# Patient Record
Sex: Female | Born: 1965
Health system: Southern US, Community
[De-identification: ages and names within clinical notes are randomized; demographics above are authoritative.]

## PROBLEM LIST (undated history)

## (undated) DIAGNOSIS — I10 Essential (primary) hypertension: Secondary | ICD-10-CM

## (undated) DIAGNOSIS — Z720 Tobacco use: Secondary | ICD-10-CM

## (undated) DIAGNOSIS — R0600 Dyspnea, unspecified: Secondary | ICD-10-CM

## (undated) DIAGNOSIS — R7989 Other specified abnormal findings of blood chemistry: Secondary | ICD-10-CM

## (undated) DIAGNOSIS — R9431 Abnormal electrocardiogram [ECG] [EKG]: Secondary | ICD-10-CM

## (undated) DIAGNOSIS — R778 Other specified abnormalities of plasma proteins: Secondary | ICD-10-CM

## (undated) DIAGNOSIS — I509 Heart failure, unspecified: Secondary | ICD-10-CM

## (undated) DIAGNOSIS — R600 Localized edema: Secondary | ICD-10-CM

---

## 1999-10-17 ENCOUNTER — Emergency Department (HOSPITAL_COMMUNITY): Admission: EM | Admit: 1999-10-17 | Discharge: 1999-10-17 | Payer: Self-pay | Admitting: Emergency Medicine

## 1999-10-24 ENCOUNTER — Emergency Department (HOSPITAL_COMMUNITY): Admission: EM | Admit: 1999-10-24 | Discharge: 1999-10-24 | Payer: Self-pay | Admitting: *Deleted

## 2000-03-22 ENCOUNTER — Inpatient Hospital Stay (HOSPITAL_COMMUNITY): Admission: AD | Admit: 2000-03-22 | Discharge: 2000-03-22 | Payer: Self-pay | Admitting: Obstetrics

## 2004-03-21 ENCOUNTER — Emergency Department (HOSPITAL_COMMUNITY): Admission: EM | Admit: 2004-03-21 | Discharge: 2004-03-21 | Payer: Self-pay | Admitting: Emergency Medicine

## 2004-05-25 ENCOUNTER — Encounter: Admission: RE | Admit: 2004-05-25 | Discharge: 2004-05-25 | Payer: Self-pay | Admitting: Internal Medicine

## 2006-03-28 ENCOUNTER — Emergency Department (HOSPITAL_COMMUNITY): Admission: EM | Admit: 2006-03-28 | Discharge: 2006-03-28 | Payer: Self-pay | Admitting: Emergency Medicine

## 2006-05-03 ENCOUNTER — Inpatient Hospital Stay (HOSPITAL_COMMUNITY): Admission: AD | Admit: 2006-05-03 | Discharge: 2006-05-03 | Payer: Self-pay | Admitting: Gynecology

## 2006-05-03 ENCOUNTER — Ambulatory Visit: Payer: Self-pay | Admitting: Certified Nurse Midwife

## 2006-05-04 ENCOUNTER — Ambulatory Visit (HOSPITAL_COMMUNITY): Admission: RE | Admit: 2006-05-04 | Discharge: 2006-05-04 | Payer: Self-pay | Admitting: Gynecology

## 2006-07-28 ENCOUNTER — Ambulatory Visit: Payer: Self-pay | Admitting: *Deleted

## 2006-07-28 ENCOUNTER — Inpatient Hospital Stay (HOSPITAL_COMMUNITY): Admission: AD | Admit: 2006-07-28 | Discharge: 2006-07-28 | Payer: Self-pay | Admitting: Obstetrics & Gynecology

## 2006-08-01 ENCOUNTER — Inpatient Hospital Stay (HOSPITAL_COMMUNITY): Admission: AD | Admit: 2006-08-01 | Discharge: 2006-08-01 | Payer: Self-pay | Admitting: Gynecology

## 2006-08-01 ENCOUNTER — Ambulatory Visit: Payer: Self-pay | Admitting: Obstetrics and Gynecology

## 2006-08-11 ENCOUNTER — Ambulatory Visit (HOSPITAL_COMMUNITY): Admission: RE | Admit: 2006-08-11 | Discharge: 2006-08-11 | Payer: Self-pay | Admitting: Gynecology

## 2006-08-11 ENCOUNTER — Ambulatory Visit: Payer: Self-pay | Admitting: Obstetrics & Gynecology

## 2006-08-18 ENCOUNTER — Ambulatory Visit: Payer: Self-pay | Admitting: Gynecology

## 2006-08-19 ENCOUNTER — Inpatient Hospital Stay (HOSPITAL_COMMUNITY): Admission: AD | Admit: 2006-08-19 | Discharge: 2006-08-21 | Payer: Self-pay | Admitting: Gynecology

## 2006-08-19 ENCOUNTER — Ambulatory Visit: Payer: Self-pay | Admitting: Gynecology

## 2010-08-29 ENCOUNTER — Encounter: Payer: Self-pay | Admitting: Internal Medicine

## 2012-09-10 ENCOUNTER — Other Ambulatory Visit: Payer: Self-pay | Admitting: Internal Medicine

## 2012-09-10 DIAGNOSIS — Z1231 Encounter for screening mammogram for malignant neoplasm of breast: Secondary | ICD-10-CM

## 2012-09-28 ENCOUNTER — Ambulatory Visit: Payer: Self-pay

## 2013-07-18 ENCOUNTER — Ambulatory Visit
Admission: RE | Admit: 2013-07-18 | Discharge: 2013-07-18 | Disposition: A | Discharge status: Home / Self Care | Payer: BC Managed Care – PPO | Source: Ambulatory Visit | Attending: Internal Medicine | Admitting: Internal Medicine

## 2013-07-18 DIAGNOSIS — Z1231 Encounter for screening mammogram for malignant neoplasm of breast: Secondary | ICD-10-CM

## 2017-05-11 ENCOUNTER — Inpatient Hospital Stay (HOSPITAL_COMMUNITY)
Admission: EM | Admit: 2017-05-11 | Discharge: 2017-05-14 | DRG: 293 | Disposition: A | Discharge status: Home / Self Care | Payer: 59 | Attending: Internal Medicine | Admitting: Internal Medicine

## 2017-05-11 ENCOUNTER — Ambulatory Visit (INDEPENDENT_AMBULATORY_CARE_PROVIDER_SITE_OTHER): Payer: 59 | Admitting: Physician Assistant

## 2017-05-11 ENCOUNTER — Ambulatory Visit (INDEPENDENT_AMBULATORY_CARE_PROVIDER_SITE_OTHER): Payer: 59

## 2017-05-11 ENCOUNTER — Emergency Department (HOSPITAL_COMMUNITY): Payer: 59

## 2017-05-11 ENCOUNTER — Encounter (HOSPITAL_COMMUNITY): Payer: Self-pay | Admitting: Emergency Medicine

## 2017-05-11 ENCOUNTER — Encounter: Payer: Self-pay | Admitting: Physician Assistant

## 2017-05-11 VITALS — BP 178/98 | HR 97 | Temp 98.5°F | Resp 16 | Ht 66.0 in | Wt 216.0 lb

## 2017-05-11 DIAGNOSIS — R05 Cough: Secondary | ICD-10-CM

## 2017-05-11 DIAGNOSIS — Z6833 Body mass index (BMI) 33.0-33.9, adult: Secondary | ICD-10-CM

## 2017-05-11 DIAGNOSIS — Z8249 Family history of ischemic heart disease and other diseases of the circulatory system: Secondary | ICD-10-CM

## 2017-05-11 DIAGNOSIS — R778 Other specified abnormalities of plasma proteins: Secondary | ICD-10-CM | POA: Diagnosis present

## 2017-05-11 DIAGNOSIS — I43 Cardiomyopathy in diseases classified elsewhere: Secondary | ICD-10-CM | POA: Diagnosis present

## 2017-05-11 DIAGNOSIS — R0600 Dyspnea, unspecified: Secondary | ICD-10-CM

## 2017-05-11 DIAGNOSIS — D649 Anemia, unspecified: Secondary | ICD-10-CM | POA: Diagnosis present

## 2017-05-11 DIAGNOSIS — I509 Heart failure, unspecified: Secondary | ICD-10-CM

## 2017-05-11 DIAGNOSIS — F17210 Nicotine dependence, cigarettes, uncomplicated: Secondary | ICD-10-CM | POA: Diagnosis present

## 2017-05-11 DIAGNOSIS — R9431 Abnormal electrocardiogram [ECG] [EKG]: Secondary | ICD-10-CM | POA: Diagnosis not present

## 2017-05-11 DIAGNOSIS — I10 Essential (primary) hypertension: Secondary | ICD-10-CM | POA: Diagnosis present

## 2017-05-11 DIAGNOSIS — R7989 Other specified abnormal findings of blood chemistry: Secondary | ICD-10-CM | POA: Diagnosis present

## 2017-05-11 DIAGNOSIS — R748 Abnormal levels of other serum enzymes: Secondary | ICD-10-CM | POA: Diagnosis not present

## 2017-05-11 DIAGNOSIS — R0602 Shortness of breath: Secondary | ICD-10-CM

## 2017-05-11 DIAGNOSIS — Z23 Encounter for immunization: Secondary | ICD-10-CM

## 2017-05-11 DIAGNOSIS — Z720 Tobacco use: Secondary | ICD-10-CM | POA: Diagnosis not present

## 2017-05-11 DIAGNOSIS — R600 Localized edema: Secondary | ICD-10-CM | POA: Diagnosis present

## 2017-05-11 DIAGNOSIS — R059 Cough, unspecified: Secondary | ICD-10-CM

## 2017-05-11 DIAGNOSIS — I110 Hypertensive heart disease with heart failure: Principal | ICD-10-CM | POA: Diagnosis present

## 2017-05-11 DIAGNOSIS — Z9114 Patient's other noncompliance with medication regimen: Secondary | ICD-10-CM

## 2017-05-11 DIAGNOSIS — I5043 Acute on chronic combined systolic (congestive) and diastolic (congestive) heart failure: Secondary | ICD-10-CM | POA: Diagnosis not present

## 2017-05-11 DIAGNOSIS — I5041 Acute combined systolic (congestive) and diastolic (congestive) heart failure: Secondary | ICD-10-CM | POA: Diagnosis present

## 2017-05-11 DIAGNOSIS — R0601 Orthopnea: Secondary | ICD-10-CM | POA: Diagnosis present

## 2017-05-11 DIAGNOSIS — E669 Obesity, unspecified: Secondary | ICD-10-CM | POA: Diagnosis present

## 2017-05-11 DIAGNOSIS — Z791 Long term (current) use of non-steroidal anti-inflammatories (NSAID): Secondary | ICD-10-CM

## 2017-05-11 HISTORY — DX: Dyspnea, unspecified: R06.00

## 2017-05-11 HISTORY — DX: Other specified abnormal findings of blood chemistry: R79.89

## 2017-05-11 HISTORY — DX: Other specified abnormalities of plasma proteins: R77.8

## 2017-05-11 HISTORY — DX: Tobacco use: Z72.0

## 2017-05-11 HISTORY — DX: Essential (primary) hypertension: I10

## 2017-05-11 HISTORY — DX: Localized edema: R60.0

## 2017-05-11 HISTORY — DX: Abnormal electrocardiogram (ECG) (EKG): R94.31

## 2017-05-11 LAB — BASIC METABOLIC PANEL
Anion gap: 7 (ref 5–15)
BUN: 7 mg/dL (ref 6–20)
CO2: 24 mmol/L (ref 22–32)
Calcium: 8.9 mg/dL (ref 8.9–10.3)
Chloride: 106 mmol/L (ref 101–111)
Creatinine, Ser: 0.7 mg/dL (ref 0.44–1.00)
GFR calc Af Amer: >60 mL/min (ref >60)
GFR calc non Af Amer: >60 mL/min (ref >60)
Glucose, Bld: 91 mg/dL (ref 65–99)
Potassium: 4.1 mmol/L (ref 3.5–5.1)
Sodium: 137 mmol/L (ref 135–145)

## 2017-05-11 LAB — CBC WITH DIFFERENTIAL/PLATELET
Basophils Absolute: 0 10*3/uL (ref 0.0–0.1)
Basophils Relative: 0 %
Eosinophils Absolute: 0.1 10*3/uL (ref 0.0–0.7)
Eosinophils Relative: 2 %
HCT: 35.6 % — ABNORMAL LOW (ref 36.0–46.0)
Hemoglobin: 11 g/dL — ABNORMAL LOW (ref 12.0–15.0)
Lymphocytes Relative: 40 %
Lymphs Abs: 2 10*3/uL (ref 0.7–4.0)
MCH: 27.5 pg (ref 26.0–34.0)
MCHC: 30.9 g/dL (ref 30.0–36.0)
MCV: 89 fL (ref 78.0–100.0)
Monocytes Absolute: 0.4 10*3/uL (ref 0.1–1.0)
Monocytes Relative: 8 %
Neutro Abs: 2.4 10*3/uL (ref 1.7–7.7)
Neutrophils Relative %: 50 %
Platelets: 196 10*3/uL (ref 150–400)
RBC: 4 MIL/uL (ref 3.87–5.11)
RDW: 14.5 % (ref 11.5–15.5)
WBC: 4.9 10*3/uL (ref 4.0–10.5)

## 2017-05-11 LAB — LIPID PANEL
CHOL/HDL RATIO: 2.5 ratio
CHOLESTEROL: 140 mg/dL (ref 0–200)
HDL: 56 mg/dL (ref >40)
LDL Cholesterol: 60 mg/dL (ref 0–99)
Triglycerides: 120 mg/dL (ref <150)
VLDL: 24 mg/dL (ref 0–40)

## 2017-05-11 LAB — TROPONIN I
Troponin I: 0.03 ng/mL (ref <0.03)
Troponin I: 0.03 ng/mL (ref <0.03)

## 2017-05-11 LAB — BRAIN NATRIURETIC PEPTIDE: B Natriuretic Peptide: 328.7 pg/mL — ABNORMAL HIGH (ref 0.0–100.0)

## 2017-05-11 MED ORDER — ASPIRIN EC 325 MG PO TBEC
325.0000 mg | DELAYED_RELEASE_TABLET | Freq: Once | ORAL | Status: AC
  Administered 2017-05-11: 325 mg via ORAL
  Filled 2017-05-11: qty 1

## 2017-05-11 MED ORDER — ACETAMINOPHEN 325 MG PO TABS: 650.0000 mg | ORAL_TABLET | Freq: Four times a day (QID) | ORAL | Status: DC | PRN

## 2017-05-11 MED ORDER — SENNOSIDES-DOCUSATE SODIUM 8.6-50 MG PO TABS: 1.0000 | ORAL_TABLET | Freq: Every evening | ORAL | Status: DC | PRN

## 2017-05-11 MED ORDER — FUROSEMIDE 10 MG/ML IJ SOLN
60.0000 mg | Freq: Once | INTRAMUSCULAR | Status: AC
  Administered 2017-05-11: 60 mg via INTRAVENOUS
  Filled 2017-05-11: qty 6

## 2017-05-11 MED ORDER — LOSARTAN POTASSIUM 50 MG PO TABS
50.0000 mg | ORAL_TABLET | Freq: Every day | ORAL | Status: DC
  Filled 2017-05-11: qty 1

## 2017-05-11 MED ORDER — PNEUMOCOCCAL VAC POLYVALENT 25 MCG/0.5ML IJ INJ
0.5000 mL | INJECTION | INTRAMUSCULAR | Status: AC
  Administered 2017-05-14: 0.5 mL via INTRAMUSCULAR
  Filled 2017-05-11: qty 0.5

## 2017-05-11 MED ORDER — NICOTINE 21 MG/24HR TD PT24
21.0000 mg | MEDICATED_PATCH | Freq: Every day | TRANSDERMAL | Status: DC
  Administered 2017-05-11 – 2017-05-13 (×3): 21 mg via TRANSDERMAL
  Filled 2017-05-11 (×4): qty 1

## 2017-05-11 MED ORDER — METOPROLOL SUCCINATE ER 25 MG PO TB24
25.0000 mg | ORAL_TABLET | Freq: Every day | ORAL | Status: DC
  Administered 2017-05-12 – 2017-05-14 (×3): 25 mg via ORAL
  Filled 2017-05-11 (×3): qty 1

## 2017-05-11 MED ORDER — HYDRALAZINE HCL 20 MG/ML IJ SOLN
5.0000 mg | INTRAMUSCULAR | Status: DC | PRN
  Administered 2017-05-11: 10 mg via INTRAVENOUS
  Filled 2017-05-11 (×2): qty 1

## 2017-05-11 MED ORDER — METOPROLOL TARTRATE 25 MG PO TABS: 12.5000 mg | ORAL_TABLET | Freq: Two times a day (BID) | ORAL | Status: DC

## 2017-05-11 MED ORDER — INFLUENZA VAC SPLIT QUAD 0.5 ML IM SUSY
0.5000 mL | PREFILLED_SYRINGE | INTRAMUSCULAR | Status: AC
  Administered 2017-05-14: 0.5 mL via INTRAMUSCULAR
  Filled 2017-05-11: qty 0.5

## 2017-05-11 MED ORDER — FUROSEMIDE 10 MG/ML IJ SOLN
40.0000 mg | Freq: Two times a day (BID) | INTRAMUSCULAR | Status: DC
  Administered 2017-05-12 – 2017-05-13 (×4): 40 mg via INTRAVENOUS
  Filled 2017-05-11 (×5): qty 4

## 2017-05-11 MED ORDER — IBUPROFEN 600 MG PO TABS: 600.0000 mg | ORAL_TABLET | Freq: Three times a day (TID) | ORAL | Status: DC | PRN

## 2017-05-11 MED ORDER — ASPIRIN 325 MG PO TABS
325.0000 mg | ORAL_TABLET | Freq: Every day | ORAL | Status: DC
  Administered 2017-05-12 – 2017-05-14 (×3): 325 mg via ORAL
  Filled 2017-05-11 (×3): qty 1

## 2017-05-11 MED ORDER — ONDANSETRON HCL 4 MG PO TABS: 4.0000 mg | ORAL_TABLET | Freq: Four times a day (QID) | ORAL | Status: DC | PRN

## 2017-05-11 MED ORDER — ENOXAPARIN SODIUM 40 MG/0.4ML ~~LOC~~ SOLN
40.0000 mg | SUBCUTANEOUS | Status: DC
  Filled 2017-05-11: qty 0.4

## 2017-05-11 MED ORDER — ACETAMINOPHEN 650 MG RE SUPP: 650.0000 mg | Freq: Four times a day (QID) | RECTAL | Status: DC | PRN

## 2017-05-11 MED ORDER — ONDANSETRON HCL 4 MG/2ML IJ SOLN: 4.0000 mg | Freq: Four times a day (QID) | INTRAMUSCULAR | Status: DC | PRN

## 2017-05-11 NOTE — H&P (Signed)
History and Physical    Teresa [name] OEH:[id number] DOB: [birth year] DOA: 05/11/2017  PCP: Patient, No Pcp Per Patient coming from: home  Chief Complaint: dyspnea/LE edema  HPI: Teresa [name] is a 51 y.o. female with medical history significant hypertension, noncompliance, tobacco use since emergency department from her primary care physician's office chief complaint of shortness of breath, lower extremity edema. PCP concerned about abnormal EKG. Initial evaluation concerning for new onset heart failure. [facility] asked to admit.  Information is obtained from the patient. She states she was diagnosed with hypertension more than 10 years ago. She has not taken any medications for this in almost 2 years as she "stopped going to the doctor". She states over the last month she has developed worsening dyspnea with exertion lower extremity edema and orthopnea. She denies any chest pain palpitations headache dizziness syncope or near-syncope. She denies abdominal pain nausea vomiting dysuria hematuria frequency or urgency. She admits to some continuing to smoke but denies any diagnosis of COPD or asthma.  ED Course: Emergency department she's afebrile blood pressure with poor control. She is provided with 60 mg of Lasix IV and aspirin.  Review of Systems: As per HPI otherwise all other systems reviewed and are negative.   Ambulatory Status: Ambulates independently and is independent with ADLs  Past Medical History:  Diagnosis Date  . Abnormal EKG   . Dyspnea   . Elevated troponin   . HTN (hypertension)   . Leg edema   . Tobacco abuse     History reviewed. No pertinent surgical history.  Social History   Social History  . Marital status: Divorced    Spouse name: N/A  . Number of children: N/A  . Years of education: N/A   Occupational History  . Not on file.   Social History Main Topics  . Smoking status: Current Every Day Smoker  . Smokeless tobacco: Never Used  .  Alcohol use Not on file  . Drug use: Unknown  . Sexual activity: Not on file   Other Topics Concern  . Not on file   Social History Narrative  . No narrative on file    Not on File  Family History  Problem Relation Age of Onset  . CAD Mother   . Diabetes Mother     Prior to Admission medications   Medication Sig Start Date End Date Taking? Authorizing Provider  ibuprofen (ADVIL,MOTRIN) 200 MG tablet Take 600 mg by mouth every 8 (eight) hours as needed for moderate pain.   Yes [provider]  Pseudoephedrine-APAP-DM (DAYQUIL PO) Take 2 capsules by mouth as needed.   Yes [provider]    Physical Exam: Vitals:   05/11/17 1345 05/11/17 1350 05/11/17 1445 05/11/17 1530  BP: (!) 157/90 (!) 163/102 (!) 173/90 (!) 166/85  Pulse: 65 81 71 88  Resp: 13  18 (!) 22  Temp:      TempSrc:      SpO2: 97% 96% 94% 98%  Weight:      Height:         General:  Appears calm and comfortable Sitting straight up in bed in no acute distress Eyes:  PERRL, EOMI, normal lids, iris ENT:  grossly normal hearing, lips & tongue, mucous membranes of her mouth are moist and pink very poor dentition Neck:  no LAD, masses or thyromegaly Cardiovascular:  RRR, no m/r/g. Trace LE edema.  Respiratory:   Normal respiratory effort. breath sounds with fair air movement. No  wheezes no crackles no rails Abdomen:  soft, ntnd, obese positive bowel sounds no guarding or rebounding Skin:  no rash or induration seen on limited exam Musculoskeletal:  grossly normal tone BUE/BLE, good ROM, no bony abnormality Psychiatric:  grossly normal mood and affect, speech fluent and appropriate, AOx3 Neurologic:  CN 2-12 grossly intact, moves all extremities in coordinated fashion, sensation intact  Labs on Admission: I have personally reviewed following labs and imaging studies  CBC:  Recent Labs Lab 05/11/17 1323  WBC 4.9  NEUTROABS 2.4  HGB 11.0*  HCT 35.6*  MCV 89.0  PLT 196   Basic  Metabolic Panel:  Recent Labs Lab 05/11/17 1323  NA 137  K 4.1  CL 106  CO2 24  GLUCOSE 91  BUN 7  CREATININE 0.70  CALCIUM 8.9   GFR: Estimated Creatinine Clearance: 97.1 mL/min (by C-G formula based on SCr of 0.7 mg/dL). Liver Function Tests: No results for input(s): AST, ALT, ALKPHOS, BILITOT, PROT, ALBUMIN in the last 168 hours. No results for input(s): LIPASE, AMYLASE in the last 168 hours. No results for input(s): AMMONIA in the last 168 hours. Coagulation Profile: No results for input(s): INR, PROTIME in the last 168 hours. Cardiac Enzymes:  Recent Labs Lab 05/11/17 1323  TROPONINI 0.03*   BNP (last 3 results) No results for input(s): PROBNP in the last 8760 hours. HbA1C: No results for input(s): HGBA1C in the last 72 hours. CBG: No results for input(s): GLUCAP in the last 168 hours. Lipid Profile: No results for input(s): CHOL, HDL, LDLCALC, TRIG, CHOLHDL, LDLDIRECT in the last 72 hours. Thyroid Function Tests: No results for input(s): TSH, T4TOTAL, FREET4, T3FREE, THYROIDAB in the last 72 hours. Anemia Panel: No results for input(s): VITAMINB12, FOLATE, FERRITIN, TIBC, IRON, RETICCTPCT in the last 72 hours. Urine analysis: No results found for: COLORURINE, APPEARANCEUR, LABSPEC, PHURINE, GLUCOSEU, HGBUR, BILIRUBINUR, KETONESUR, PROTEINUR, UROBILINOGEN, NITRITE, LEUKOCYTESUR  Creatinine Clearance: Estimated Creatinine Clearance: 97.1 mL/min (by C-G formula based on SCr of 0.7 mg/dL).  Sepsis Labs: (procalcitonin:4,lacticidven:4) )No results found for this or any previous visit (from the past 240 hour(s)).   Radiological Exams on Admission: Dg Chest 2 View  Result Date: 05/11/2017 CLINICAL DATA:  Initial evaluation for acute bilateral chest pain. EXAM: CHEST  2 VIEW COMPARISON:  Prior radiograph from 05/11/2017. FINDINGS: Moderate cardiomegaly, stable. Mediastinal silhouette within normal limits. Lungs normally inflated. Mild diffuse vascular and  interstitial prominence, suggesting mild pulmonary interstitial congestion. No frank alveolar edema. Superimposed streaky left basilar atelectasis. No consolidative airspace disease. No definite pleural effusion. No pneumothorax. No acute osseus abnormality. Degenerative changes present about the shoulders. IMPRESSION: 1. Cardiomegaly with mild diffuse pulmonary interstitial congestion without frank pulmonary edema. 2. Mild left basilar atelectasis.  No focal infiltrates identified. Electronically Signed   By: Rise Mu M.D.   On: 05/11/2017 14:01   Dg Chest 2 View  Result Date: 05/11/2017 CLINICAL DATA:  Cough, shortness of breath. EXAM: CHEST  2 VIEW COMPARISON:  None. FINDINGS: Mild cardiomegaly is noted with mild central pulmonary vascular congestion. No pneumothorax or pleural effusion is noted. Bony thorax is unremarkable. No consolidative process is noted. IMPRESSION: Mild cardiomegaly with mild central pulmonary vascular congestion. Electronically Signed   By: Teresa Raider, M.D.   On: 05/11/2017 11:39    EKG: Independently reviewed. Sinus rhythm LVH with IVCD and secondary repol abnrm   Assessment/Plan Principal Problem:   Dyspnea Active Problems:   Hypertension   Orthopnea   Leg edema   Anemia  Elevated troponin   Abnormal EKG   #1. Dyspnea/LE edema/orthopnea. Likely related to early CHF in the setting of uncontrolled blood pressure. Chest x-ray with mild cardiomegaly and mild central pulmonary vascular congestion. BNP 328. -Admit to telemetry -Blood pressure control -Monitor intake and output -Continue Lasix -Obtain an echocardiogram -Await cardiology recommendation  #2. Elevated troponin/abnormal EKG. EKG with sinus rhythm LVH. Initial troponin 0.03. Patient denies any chest pain. Provided with an aspirin in the emergency department -Cycle troponin -Serial EKG -Continue aspirin -Obtain a lipid panel -Await cardiology recommendations  #3. Hypertension.  Patient noncompliant with antihypertensive meds for almost 2 years. She is unsure of what med she's been on previously. She received 60 mg Lasix in the emergency department. -Continue Lasix IV for now secondary to #1 - Start Hydralazine IV for prn control. Would likely benefit longterm from ACEi and Bblocker though not during acute new onset failure and diuresis.  -Monitor closely    DVT prophylaxis: lovenox  Code Status: full  Family Communication: none present  Disposition Plan: home  Consults called: [place] Admission status: obs    Toya Smothers M MD [facility]  If 7PM-7AM, please contact night-coverage [email or web address] Password [facility]  05/11/2017, 5:02 PM      Attending MD note  Patient was seen, examined,treatment plan was discussed with the  Advance Practice Provider.  I have personally reviewed the clinical findings, lab,EKG, imaging studies and management of this patient in detail.I have also reviewed the orders written for this patient which were under my direction. I agree with the documentation, as recorded by the Advance Practice Provider.   Teresa [name] is a 51 y.o. 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 Pt chronically noncompliant w/ antihypertensives. No other medical problems. Family history of cardiovascular disease and early death from heart attack. Elevated troponin negligable and likely from strain. PCP reported EKG changes though none to compare in our system. Current EKG w/ possible LVH but w/o signs of ACS.  ASA given in ED and Lasix for initial diuresis. Echo and cardiology evaluation pending.   Tobacco cessation counseling provided and placed on nicotine patch.   Appreciate the assistance of cardiology in the management of this pt.     Ozella Rocks, MD Family Medicine See Amion for pager # [facility]

## 2017-05-11 NOTE — ED Notes (Signed)
Bedside commode place in room, pt ambulatory to bedside commode independently

## 2017-05-11 NOTE — Consult Note (Signed)
Cardiology Consultation:   Patient ID: Teresa [name]; [id number]; Jul 14, 1966   Admit date: 05/11/2017 Date of Consult: 05/11/2017  Primary Care Provider: Patient, No Pcp Per Primary Cardiologist: None Primary Electrophysiologist:  None    History of Present Illness:   Teresa [name] is a 51 y.o. female with a hx of HTN who is being seen today for the evaluation of possible congestive heart failure  at the request of Dr Teresa [name] Patient has not Sought medical attention for a while. She has HTN and has not taken any meds for it. .She also smokes. Sent to ER by PCP for dyspnea , edema and abnormal ECG. SOB for  A month no chest pain. CXR showed cardiomegaly with mild interstitial edema Labs with normal Cr .70 troponin .03 and BNP 328  ECG shows SR with LVH and strain. She received Iv lasix and is still HTN in ER. She has no complaints currently. Denies excess ETOH or other drugs.    Past Medical History:  Diagnosis Date  . Abnormal EKG   . Dyspnea   . Elevated troponin   . HTN (hypertension)   . Leg edema   . Tobacco abuse     History reviewed. No pertinent surgical history.   Home Medications:  Prior to Admission medications   Medication Sig Start Date End Date Taking? Authorizing Provider  ibuprofen (ADVIL,MOTRIN) 200 MG tablet Take 600 mg by mouth every 8 (eight) hours as needed for moderate pain.   Yes [provider]  Pseudoephedrine-APAP-DM (DAYQUIL PO) Take 2 capsules by mouth as needed.   Yes [provider]    Inpatient Medications: Scheduled Meds: . [START ON 05/12/2017] aspirin  325 mg Oral Daily  . enoxaparin (LOVENOX) injection  40 mg Subcutaneous Q24H  . [START ON 05/12/2017] furosemide  40 mg Intravenous BID  . nicotine  21 mg Transdermal Daily   Continuous Infusions:  PRN Meds: acetaminophen **OR** acetaminophen, hydrALAZINE, ibuprofen, ondansetron **OR** ondansetron (ZOFRAN) IV, senna-docusate  Allergies:   Not on File  Social  History:   Social History   Social History  . Marital status: Divorced    Spouse name: N/A  . Number of children: N/A  . Years of education: N/A   Occupational History  . Not on file.   Social History Main Topics  . Smoking status: Current Every Day Smoker  . Smokeless tobacco: Never Used  . Alcohol use Not on file  . Drug use: Unknown  . Sexual activity: Not on file   Other Topics Concern  . Not on file   Social History Narrative  . No narrative on file    Family History:    Family History  Problem Relation Age of Onset  . CAD Mother   . Diabetes Mother      ROS:  Please see the history of present illness.  ROS  All other ROS reviewed and negative.     Physical Exam/Data:   Vitals:   05/11/17 1530 05/11/17 1600 05/11/17 1730 05/11/17 1752  BP: (!) 166/85 (!) 158/90 (!) 179/99 (!) 180/108  Pulse: 88 76 86 87  Resp: (!) 22  (!) 22 18  Temp:    98.5 F (36.9 C)  TempSrc:    Oral  SpO2: 98% 91% 96% 100%  Weight:    208 lb 14.4 oz (94.8 kg)  Height:    5\' 6"  (1.676 m)    Intake/Output Summary (Last 24 hours) at 05/11/17 1812 Last data filed at 05/11/17  1810  Gross per 24 hour  Intake                0 ml  Output             2100 ml  Net            -2100 ml   Filed Weights   05/11/17 1305 05/11/17 1752  Weight: 216 lb (98 kg) 208 lb 14.4 oz (94.8 kg)   Body mass index is 33.72 kg/m.  General:  Overweight black female in no distress  HEENT: normal Lymph: no adenopathy Neck: no JVD Endocrine:  No thryomegaly Vascular: No carotid bruits; FA pulses 2+ bilaterally without bruits  Cardiac:  normal S1, S2; RRR; no murmur S4 gallop  Lungs:  clear to auscultation bilaterally, no wheezing, rhonchi or rales  Abd: soft, nontender, no hepatomegaly  Ext: Trace LE edema Musculoskeletal:  No deformities, BUE and BLE strength normal and equal Skin: warm and dry  Neuro:  CNs 2-12 intact, no focal abnormalities noted Psych:  Normal affect   EKG:  The EKG was  personally reviewed and demonstrates:  SR with LVH and strain  Telemetry:  Telemetry was personally reviewed and demonstrates:  NSR   Relevant CV Studies: None   Laboratory Data:  Chemistry Recent Labs Lab 05/11/17 1323  NA 137  K 4.1  CL 106  CO2 24  GLUCOSE 91  BUN 7  CREATININE 0.70  CALCIUM 8.9  GFRNONAA >60  GFRAA >60  ANIONGAP 7    No results for input(s): PROT, ALBUMIN, AST, ALT, ALKPHOS, BILITOT in the last 168 hours. Hematology Recent Labs Lab 05/11/17 1323  WBC 4.9  RBC 4.00  HGB 11.0*  HCT 35.6*  MCV 89.0  MCH 27.5  MCHC 30.9  RDW 14.5  PLT 196   Cardiac Enzymes Recent Labs Lab 05/11/17 1323  TROPONINI 0.03*   No results for input(s): TROPIPOC in the last 168 hours.  BNP Recent Labs Lab 05/11/17 1323  BNP 328.7*    DDimer No results for input(s): DDIMER in the last 168 hours.  Radiology/Studies:  Dg Chest 2 View  Result Date: 05/11/2017 CLINICAL DATA:  Initial evaluation for acute bilateral chest pain. EXAM: CHEST  2 VIEW COMPARISON:  Prior radiograph from 05/11/2017. FINDINGS: Moderate cardiomegaly, stable. Mediastinal silhouette within normal limits. Lungs normally inflated. Mild diffuse vascular and interstitial prominence, suggesting mild pulmonary interstitial congestion. No frank alveolar edema. Superimposed streaky left basilar atelectasis. No consolidative airspace disease. No definite pleural effusion. No pneumothorax. No acute osseus abnormality. Degenerative changes present about the shoulders. IMPRESSION: 1. Cardiomegaly with mild diffuse pulmonary interstitial congestion without frank pulmonary edema. 2. Mild left basilar atelectasis.  No focal infiltrates identified. Electronically Signed   By: Rise Mu M.D.   On: 05/11/2017 14:01   Dg Chest 2 View  Result Date: 05/11/2017 CLINICAL DATA:  Cough, shortness of breath. EXAM: CHEST  2 VIEW COMPARISON:  None. FINDINGS: Mild cardiomegaly is noted with mild central pulmonary  vascular congestion. No pneumothorax or pleural effusion is noted. Bony thorax is unremarkable. No consolidative process is noted. IMPRESSION: Mild cardiomegaly with mild central pulmonary vascular congestion. Electronically Signed   By: Lupita Raider, M.D.   On: 05/11/2017 11:39    Assessment and Plan:   1. HTN:  UnRx. Add ACE and beta blocker can change to oral diuretic in am 2. Dyspnea:  Signs of mild volume overload Echo to see if she has diastolic or systolic dysfunction 3.  Smoking primary service has written for nicotine patch 4. Abnormal ECG: LVH with strain troponin negative no chest pain no need for stress testing Echo ordered   I suspect this is all dietary indiscretion, non compliance with meds and HTN heart disease    For questions or updates, please contact [facility] Please consult [email or web address] for contact info under Cardiology/STEMI.   Signed, Charlton Haws, MD  05/11/2017 6:12 PM

## 2017-05-11 NOTE — Patient Instructions (Signed)
     IF you received an x-ray today, you will receive an invoice from [facility]. Please contact [facility] at [phone] with questions or concerns regarding your invoice.   IF you received labwork today, you will receive an invoice from [facility]. Please contact [facility] at [phone] with questions or concerns regarding your invoice.   Our billing staff will not be able to assist you with questions regarding bills from these companies.  You will be contacted with the lab results as soon as they are available. The fastest way to get your results is to activate your My Chart account. Instructions are located on the last page of this paperwork. If you have not heard from us regarding the results in 2 weeks, please contact this office.     

## 2017-05-11 NOTE — ED Triage Notes (Signed)
Pt arrives via [facility] from PCP with concerns of ECG changes. Complaints of SOB x1 month, fatigue, and back pain that radiates to lower abdomen. Pt states hx of hypertension but has not taken HTN meds in over a year. EMS vitals 182/102, HR 98, RR 16, 97% RA, CBG 104.

## 2017-05-11 NOTE — ED Notes (Signed)
CRITICAL VALUE ALERT  Critical Value:  Troponin 0.03  Date & Time Notied:  05/11/2017 @ 1430  Provider Notified: Dr. Freida Busman  Orders Received/Actions taken: n/a

## 2017-05-11 NOTE — Progress Notes (Signed)
PRIMARY CARE AT [place], [place] [address] [phone]  Date:  05/11/2017   Name:  Teresa [name]   DOB:  26-Apr-1966   MRN:  [id number]  PCP:  Patient, No Pcp Per    History of Present Illness:  Teresa [name] is a 51 y.o. female patient who presents to PCP with  Chief Complaint  Patient presents with  . Nausea    in the day at work/ x 1 month  . Shortness of Breath    at night, no so much in daytime, hurts under ribs/ x 41month    1 month of symptoms with progressive worsening.  She has sob when she lays down at night.  She has then noticed some regurgitation.  This rarely will occur during the day, but can.    Then breathing issues began.  At night, she feels like she can not breathe, and her heart is racing.  She will drink tea to help her symptoms.  She has a dry cough.  Worsening to every night.  During the day, her symptoms are not apparent.  Yesterday, she experienced nausea between 2 and 4 pm.  She notes that she is having rattling of her chest and wheezing.  Denies sob.  States she does get hot and cold, but not diaphoretic.  No lower extremity edema. She generally eats banquet dinners, bacon and eggs, fast food.  She does not eat spicy foods.  She eats tomato based foods.  Hamburgers will give her loose stool.  She has upper abdominal pain.   She has not had any sour taste in mouth.   etOH use beer 2 16 oz per day.  She has not had in 2 days.  She has not had any history of pancreas. CAD of mother, passed away due to this in late 598's   She was on blood pressure medication [facility] (diovan).  She stopped for over 1 year.    There are no active problems to display for this patient.   No past medical history on file.  No past surgical history on file.  Social History  Substance Use Topics  . Smoking status: Current Every Day Smoker  . Smokeless tobacco: Never Used  . Alcohol use Not on file    No family history on file.  Not on  File  Medication list has been reviewed and updated.  No current outpatient prescriptions on file prior to visit.   No current facility-administered medications on file prior to visit.     ROS ROS otherwise unremarkable unless listed above.  Physical Examination: BP (!) 178/98   Pulse 97   Temp 98.5 F (36.9 C) (Oral)   Resp 16   Ht '5\' 6"'$  (1.676 m)   Wt 216 lb (98 kg)   SpO2 97%   BMI 34.86 kg/m  Ideal Body Weight: Weight in (lb) to have BMI = 25: 154.6  Physical Exam  Constitutional: She is oriented to person, place, and time. She appears well-developed and well-nourished. No distress.  HENT:  Head: Normocephalic and atraumatic.  Right Ear: External ear normal.  Left Ear: External ear normal.  Eyes: Pupils are equal, round, and reactive to light. Conjunctivae and EOM are normal.  Cardiovascular: Normal rate, regular rhythm, normal heart sounds and normal pulses.  Exam reveals no distant heart sounds and no decreased pulses.   No murmur heard. Pulses:      Radial pulses are 2+ on the right  side, and 2+ on the left side.       Dorsalis pedis pulses are 2+ on the right side, and 2+ on the left side.  Minimal lower extremity edema  Pulmonary/Chest: Effort normal. No respiratory distress.  Neurological: She is alert and oriented to person, place, and time.  Skin: She is not diaphoretic.  Psychiatric: She has a normal mood and affect. Her behavior is normal.   Dg Chest 2 View  Result Date: 05/11/2017 CLINICAL DATA:  Cough, shortness of breath. EXAM: CHEST  2 VIEW COMPARISON:  None. FINDINGS: Mild cardiomegaly is noted with mild central pulmonary vascular congestion. No pneumothorax or pleural effusion is noted. Bony thorax is unremarkable. No consolidative process is noted. IMPRESSION: Mild cardiomegaly with mild central pulmonary vascular congestion. Electronically Signed   By: Marijo Conception, M.D.   On: 05/11/2017 11:39     Assessment and Plan: Teresa [name] is a 51  y.o. female who is here today  May need to insure possible NSTEMI.  Troponin monitoring with stress and echo to possibly follow.  Possible atypical cardiac symptoms, and would best be served immediately.   Ems contacted Possible gerd vs cardiac changes Given oxygen.    Cough - Plan: CBC, CMP14+EGFR, DG Chest 2 View  Shortness of breath - Plan: CBC, CMP14+EGFR, DG Chest 2 View, EKG 12-Lead  Nonspecific abnormal electrocardiogram (ECG) (EKG)  Ivar Drape, PA-C [facility] 10/4/20181:16 PM

## 2017-05-11 NOTE — ED Provider Notes (Signed)
[facility]-EMERGENCY DEPT Provider Note   CSN: [id number] Arrival date & time: 05/11/17  1303     History   Chief Complaint Chief Complaint  Patient presents with  . Shortness of Breath    HPI Teresa [name] is a 51 y.o. female.  51 year old female presents with one-month history of shortness of breath Denies any associated cough or congestion. No fever or chills. 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 Denies any pedal edema.no pleuritic component to this. Does have agreater than 40-pack-year history of tobacco use. Denies any prior diagnosis of COPD or asthma. Denies any anginal type chest pain.was seen in her doctor's office today and had EKG and there was concern for changes and she was sent here for further management. She denies taking any medications at this time and does have a remote history of hypertension but she has been noncompliant with meds for that      History reviewed. No pertinent past medical history.  There are no active problems to display for this patient.   History reviewed. No pertinent surgical history.  OB History    No data available       Home Medications    Prior to Admission medications   Not on File    Family History History reviewed. No pertinent family history.  Social History Social History  Substance Use Topics  . Smoking status: Current Every Day Smoker  . Smokeless tobacco: Never Used  . Alcohol use Not on file     Allergies   Patient has no allergy information on record.   Review of Systems Review of Systems  All other systems reviewed and are negative.    Physical Exam Updated Vital Signs BP (!) 170/92 (BP Location: Left Arm)   Pulse 81   Temp 98.7 F (37.1 C) (Oral)   Ht 1.676 m (5\' 6" )   Wt 98 kg (216 lb)   SpO2 94%   BMI 34.86 kg/m   Physical  Exam  Constitutional: She is oriented to person, place, and time. She appears well-developed and well-nourished.  Non-toxic appearance. No distress.  HENT:  Head: Normocephalic and atraumatic.  Eyes: Pupils are equal, round, and reactive to light. Conjunctivae, EOM and lids are normal.  Neck: Normal range of motion. Neck supple. No tracheal deviation present. No thyroid mass present.  Cardiovascular: Normal rate, regular rhythm and normal heart sounds.  Exam reveals no gallop.   No murmur heard. Pulmonary/Chest: Effort normal and breath sounds normal. No stridor. No respiratory distress. She has no decreased breath sounds. She has no wheezes. She has no rhonchi. She has no rales.  Abdominal: Soft. Normal appearance and bowel sounds are normal. She exhibits no distension. There is no tenderness. There is no rebound and no CVA tenderness.  Musculoskeletal: Normal range of motion. She exhibits no edema or tenderness.  Neurological: She is alert and oriented to person, place, and time. She has normal strength. No cranial nerve deficit or sensory deficit. GCS eye subscore is 4. GCS verbal subscore is 5. GCS motor subscore is 6.  Skin: Skin is warm and dry. No abrasion and no rash noted.  Psychiatric: She has a normal mood and affect. Her speech is normal and behavior is normal.  Nursing note and vitals reviewed.    ED Treatments / Results  Labs (all  labs ordered are listed, but only abnormal results are displayed) Labs Reviewed  CBC WITH DIFFERENTIAL/PLATELET  BRAIN NATRIURETIC PEPTIDE  TROPONIN I  BASIC METABOLIC PANEL    EKG  EKG Interpretation None       Radiology Dg Chest 2 View  Result Date: 05/11/2017 CLINICAL DATA:  Cough, shortness of breath. EXAM: CHEST  2 VIEW COMPARISON:  None. FINDINGS: Mild cardiomegaly is noted with mild central pulmonary vascular congestion. No pneumothorax or pleural effusion is noted. Bony thorax is unremarkable. No consolidative process is noted.  IMPRESSION: Mild cardiomegaly with mild central pulmonary vascular congestion. Electronically Signed   By: Lupita Raider, M.D.   On: 05/11/2017 11:39    Procedures Procedures (including critical care time)  Medications Ordered in ED Medications - No data to display   Initial Impression / Assessment and Plan / ED Course  I have reviewed the triage vital signs and the nursing notes.  Pertinent labs & imaging results that were available during my care of the patient were reviewed by me and considered in my medical decision making (see chart for details).     Patient given Lasix for increased lung edema. Also given aspirin. Discussed with cardiology who will see the patient consultation and request hospitalist admission. Discussed with hospitalist will come and see the patient  Final Clinical Impressions(s) / ED Diagnoses   Final diagnoses:  SOB (shortness of breath)    New Prescriptions New Prescriptions   No medications on file     Lorre Nick, MD 05/11/17 1551

## 2017-05-12 ENCOUNTER — Observation Stay (HOSPITAL_BASED_OUTPATIENT_CLINIC_OR_DEPARTMENT_OTHER): Payer: 59

## 2017-05-12 DIAGNOSIS — F17210 Nicotine dependence, cigarettes, uncomplicated: Secondary | ICD-10-CM | POA: Diagnosis present

## 2017-05-12 DIAGNOSIS — Z8249 Family history of ischemic heart disease and other diseases of the circulatory system: Secondary | ICD-10-CM | POA: Diagnosis not present

## 2017-05-12 DIAGNOSIS — Z6833 Body mass index (BMI) 33.0-33.9, adult: Secondary | ICD-10-CM | POA: Diagnosis not present

## 2017-05-12 DIAGNOSIS — I340 Nonrheumatic mitral (valve) insufficiency: Secondary | ICD-10-CM

## 2017-05-12 DIAGNOSIS — I110 Hypertensive heart disease with heart failure: Secondary | ICD-10-CM | POA: Diagnosis not present

## 2017-05-12 DIAGNOSIS — I5041 Acute combined systolic (congestive) and diastolic (congestive) heart failure: Secondary | ICD-10-CM | POA: Diagnosis present

## 2017-05-12 DIAGNOSIS — R0601 Orthopnea: Secondary | ICD-10-CM | POA: Diagnosis not present

## 2017-05-12 DIAGNOSIS — Z791 Long term (current) use of non-steroidal anti-inflammatories (NSAID): Secondary | ICD-10-CM | POA: Diagnosis not present

## 2017-05-12 DIAGNOSIS — E669 Obesity, unspecified: Secondary | ICD-10-CM | POA: Diagnosis present

## 2017-05-12 DIAGNOSIS — R0600 Dyspnea, unspecified: Secondary | ICD-10-CM | POA: Diagnosis not present

## 2017-05-12 DIAGNOSIS — I43 Cardiomyopathy in diseases classified elsewhere: Secondary | ICD-10-CM | POA: Diagnosis not present

## 2017-05-12 DIAGNOSIS — R600 Localized edema: Secondary | ICD-10-CM | POA: Diagnosis not present

## 2017-05-12 DIAGNOSIS — R9431 Abnormal electrocardiogram [ECG] [EKG]: Secondary | ICD-10-CM | POA: Diagnosis not present

## 2017-05-12 DIAGNOSIS — Z9114 Patient's other noncompliance with medication regimen: Secondary | ICD-10-CM | POA: Diagnosis not present

## 2017-05-12 DIAGNOSIS — R0602 Shortness of breath: Secondary | ICD-10-CM | POA: Diagnosis not present

## 2017-05-12 DIAGNOSIS — Z23 Encounter for immunization: Secondary | ICD-10-CM | POA: Diagnosis not present

## 2017-05-12 DIAGNOSIS — Z720 Tobacco use: Secondary | ICD-10-CM | POA: Diagnosis not present

## 2017-05-12 DIAGNOSIS — I509 Heart failure, unspecified: Secondary | ICD-10-CM | POA: Diagnosis not present

## 2017-05-12 DIAGNOSIS — I10 Essential (primary) hypertension: Secondary | ICD-10-CM | POA: Diagnosis not present

## 2017-05-12 DIAGNOSIS — I5043 Acute on chronic combined systolic (congestive) and diastolic (congestive) heart failure: Secondary | ICD-10-CM | POA: Diagnosis not present

## 2017-05-12 LAB — URINALYSIS, ROUTINE W REFLEX MICROSCOPIC
Bilirubin Urine: NEGATIVE
GLUCOSE, UA: NEGATIVE mg/dL
Hgb urine dipstick: NEGATIVE
Ketones, ur: NEGATIVE mg/dL
LEUKOCYTES UA: NEGATIVE
Nitrite: NEGATIVE
PH: 6 (ref 5.0–8.0)
Protein, ur: NEGATIVE mg/dL
Specific Gravity, Urine: 1.006 (ref 1.005–1.030)

## 2017-05-12 LAB — CMP14+EGFR
ALT: 17 IU/L (ref 0–32)
AST: 16 IU/L (ref 0–40)
Albumin/Globulin Ratio: 1.7 (ref 1.2–2.2)
Albumin: 4.5 g/dL (ref 3.5–5.5)
Alkaline Phosphatase: 116 IU/L (ref 39–117)
BUN/Creatinine Ratio: 11 (ref 9–23)
BUN: 8 mg/dL (ref 6–24)
Bilirubin Total: 0.4 mg/dL (ref 0.0–1.2)
CO2: 23 mmol/L (ref 20–29)
Calcium: 9.3 mg/dL (ref 8.7–10.2)
Chloride: 101 mmol/L (ref 96–106)
Creatinine, Ser: 0.74 mg/dL (ref 0.57–1.00)
GFR calc Af Amer: 108 mL/min/{1.73_m2} (ref >59)
GFR calc non Af Amer: 93 mL/min/{1.73_m2} (ref >59)
Globulin, Total: 2.6 g/dL (ref 1.5–4.5)
Glucose: 94 mg/dL (ref 65–99)
Potassium: 4.5 mmol/L (ref 3.5–5.2)
Sodium: 141 mmol/L (ref 134–144)
Total Protein: 7.1 g/dL (ref 6.0–8.5)

## 2017-05-12 LAB — BASIC METABOLIC PANEL
ANION GAP: 10 (ref 5–15)
BUN: 10 mg/dL (ref 6–20)
CALCIUM: 8.9 mg/dL (ref 8.9–10.3)
CO2: 28 mmol/L (ref 22–32)
Chloride: 101 mmol/L (ref 101–111)
Creatinine, Ser: 0.65 mg/dL (ref 0.44–1.00)
GFR calc non Af Amer: >60 mL/min (ref >60)
Glucose, Bld: 104 mg/dL — ABNORMAL HIGH (ref 65–99)
Potassium: 3.7 mmol/L (ref 3.5–5.1)
Sodium: 139 mmol/L (ref 135–145)

## 2017-05-12 LAB — CBC
HCT: 38.5 % (ref 36.0–46.0)
HEMOGLOBIN: 12.4 g/dL (ref 12.0–15.0)
Hematocrit: 38.5 % (ref 34.0–46.6)
Hemoglobin: 12 g/dL (ref 11.1–15.9)
MCH: 27.8 pg (ref 26.6–33.0)
MCH: 28.9 pg (ref 26.0–34.0)
MCHC: 31.2 g/dL — ABNORMAL LOW (ref 31.5–35.7)
MCHC: 32.2 g/dL (ref 30.0–36.0)
MCV: 89 fL (ref 79–97)
MCV: 89.7 fL (ref 78.0–100.0)
Platelets: 243 10*3/uL (ref 150–379)
Platelets: 228 10*3/uL (ref 150–400)
RBC: 4.32 x10E6/uL (ref 3.77–5.28)
RBC: 4.29 MIL/uL (ref 3.87–5.11)
RDW: 14.5 % (ref 12.3–15.4)
RDW: 14.5 % (ref 11.5–15.5)
WBC: 6.2 10*3/uL (ref 3.4–10.8)
WBC: 4.8 10*3/uL (ref 4.0–10.5)

## 2017-05-12 LAB — ECHOCARDIOGRAM COMPLETE
Height: 66 in
Weight: 3328 oz

## 2017-05-12 LAB — TROPONIN I
TROPONIN I: 0.03 ng/mL — AB (ref <0.03)
Troponin I: 0.03 ng/mL (ref <0.03)

## 2017-05-12 LAB — HIV ANTIBODY (ROUTINE TESTING W REFLEX): HIV SCREEN 4TH GENERATION: NONREACTIVE

## 2017-05-12 MED ORDER — LOSARTAN POTASSIUM 50 MG PO TABS
100.0000 mg | ORAL_TABLET | Freq: Every day | ORAL | Status: DC
  Administered 2017-05-12 – 2017-05-14 (×3): 100 mg via ORAL
  Filled 2017-05-12 (×3): qty 2

## 2017-05-12 NOTE — Progress Notes (Signed)
  Echocardiogram 2D Echocardiogram has been performed.  Teresa [name] 05/12/2017, 1:05 PM

## 2017-05-12 NOTE — Progress Notes (Signed)
Chaplain presented to the patent, introduced self and explained then of the visit was to provide documentation for an Advance Directive. Chaplain explained that the Advance Directive  provides an opportunity for the patient to to make health care decisions regarding her Health Care Power of [place], as well as other living sustaining measures. The document was left with the patient, Chaplain wlll follow up at a later time. Chaplain Janell Quiet [phone]

## 2017-05-12 NOTE — Progress Notes (Signed)
PROGRESS NOTE        PATIENT DETAILS Name: Teresa [name] Age: 51 y.o. Sex: female Date of Birth: [birth year] Admit Date: 05/11/2017 Admitting Physician Ozella Rocks, MD ZOX:[id number], No Pcp Per  Brief Narrative: Patient is a 51 y.o. female with prior history of hypertension-noncompliant with medications, tobacco use, presented with shortness of breath, thought to have probable diastolic heart failure in the setting of uncontrolled hypertension. Admitted to the hospitalist service, see below for further details  Subjective: Feels much better-denies any shortness of breath or chest pain this morning.  Assessment/Plan: Uncontrolled hypertension: Blood pressure better controlled after initiation of antihypertensive siphon will need slow optimization of current regimen of metoprolol and losartan. We will follow and adjust accordingly.  Probable Acute diastolic heart failure: Volume status much better-continue with Lasix-renal function is stable. Await echocardiogram to confirm diastolic or systolic dysfunction.  Tobacco abuse: Counseled, continue transdermal nicotine  Morning labs/Imaging ordered: yes  DVT Prophylaxis: Prophylactic Lovenox   Code Status: Full code   Family Communication: None at bedside  Disposition Plan: Remain inpatient-home likely in am  Antimicrobial agents: Anti-infectives    None      Procedures: None  CONSULTS:  cardiology  Time spent: 25- minutes-Greater than 50% of this time was spent in counseling, explanation of diagnosis, planning of further management, and coordination of care.  MEDICATIONS: Scheduled Meds: . aspirin  325 mg Oral Daily  . enoxaparin (LOVENOX) injection  40 mg Subcutaneous Q24H  . furosemide  40 mg Intravenous BID  . Influenza vac split quadrivalent PF  0.5 mL Intramuscular Tomorrow-1000  . losartan  100 mg Oral Daily  . metoprolol succinate  25 mg Oral Daily  . nicotine  21 mg  Transdermal Daily  . pneumococcal 23 valent vaccine  0.5 mL Intramuscular Tomorrow-1000   Continuous Infusions: PRN Meds:.acetaminophen **OR** acetaminophen, hydrALAZINE, ibuprofen, ondansetron **OR** ondansetron (ZOFRAN) IV, senna-docusate   PHYSICAL EXAM: Vital signs: Vitals:   05/11/17 2013 05/11/17 2237 05/11/17 2356 05/12/17 0604  BP: (!) 175/97 (!) 158/87 (!) 178/98 (!) 169/96  Pulse: 89 87 87 83  Resp: Temp: 98.2 F (36.8 C)  98.2 F (36.8 C) 98.2 F (36.8 C)  TempSrc: Oral  Oral Oral  SpO2: 100%  99% 98%  Weight:    94.3 kg (208 lb)  Height:       Filed Weights   05/11/17 1305 05/11/17 1752 05/12/17 0604  Weight: 98 kg (216 lb) 94.8 kg (208 lb 14.4 oz) 94.3 kg (208 lb)   Body mass index is 33.57 kg/m.   General appearance :Awake, alert, not in any distress. Speech Clear. Eyes:, pupils equally reactive to light and accomodation,no scleral icterus.Pink conjunctiva HEENT: Atraumatic and Normocephalic Neck: supple, no JVD. No cervical lymphadenopathy. No thyromegaly Resp:Good air entry bilaterally, no added sounds  CVS: S1 S2 regular, no murmurs.  GI: Bowel sounds present, Non tender and not distended with no gaurding, rigidity or rebound.No organomegaly Extremities: B/L Lower Ext shows no edema, both legs are warm to touch Neurology:  speech clear,Non focal, sensation is grossly intact. Psychiatric: Normal judgment and insight. Alert and oriented x 3. Normal mood. Musculoskeletal:No digital cyanosis Skin:No Rash, warm and dry Wounds:N/A  I have personally reviewed following labs and imaging studies  LABORATORY DATA: CBC:  Recent Labs Lab 05/11/17 1128 05/11/17 1323 05/12/17 0432  WBC 6.2 4.9 4.8  NEUTROABS  --  2.4  --   HGB 12.0 11.0* 12.4  HCT 38.5 35.6* 38.5  MCV 89 89.0 89.7  PLT 243 196 228    Basic Metabolic Panel:  Recent Labs Lab 05/11/17 1128 05/11/17 1323 05/12/17 0432  NA 141 137 139  K 4.5 4.1 3.7  CL 101 106 101  CO2  GLUCOSE 94 91 104*  BUN CREATININE 0.74 0.70 0.65  CALCIUM 9.3 8.9 8.9    GFR: Estimated Creatinine Clearance: 95.2 mL/min (by C-G formula based on SCr of 0.65 mg/dL).  Liver Function Tests:  Recent Labs Lab 05/11/17 1128  AST 16  ALT 17  ALKPHOS 116  BILITOT 0.4  PROT 7.1  ALBUMIN 4.5   No results for input(s): LIPASE, AMYLASE in the last 168 hours. No results for input(s): AMMONIA in the last 168 hours.  Coagulation Profile: No results for input(s): INR, PROTIME in the last 168 hours.  Cardiac Enzymes:  Recent Labs Lab 05/11/17 1323 05/11/17 1752 05/11/17 2211 05/12/17 0432  TROPONINI 0.03* 0.03* 0.03* 0.03*    BNP (last 3 results) No results for input(s): PROBNP in the last 8760 hours.  HbA1C: No results for input(s): HGBA1C in the last 72 hours.  CBG: No results for input(s): GLUCAP in the last 168 hours.  Lipid Profile:  Recent Labs  05/11/17 1752  CHOL 140  HDL 56  LDLCALC 60  TRIG 120  CHOLHDL 2.5    Thyroid Function Tests: No results for input(s): TSH, T4TOTAL, FREET4, T3FREE, THYROIDAB in the last 72 hours.  Anemia Panel: No results for input(s): VITAMINB12, FOLATE, FERRITIN, TIBC, IRON, RETICCTPCT in the last 72 hours.  Urine analysis:    Component Value Date/Time   COLORURINE STRAW (A) 05/11/2017 2340   APPEARANCEUR CLEAR 05/11/2017 2340   LABSPEC 1.006 05/11/2017 2340   PHURINE 6.0 05/11/2017 2340   GLUCOSEU NEGATIVE 05/11/2017 2340   HGBUR NEGATIVE 05/11/2017 2340   BILIRUBINUR NEGATIVE 05/11/2017 2340   KETONESUR NEGATIVE 05/11/2017 2340   PROTEINUR NEGATIVE 05/11/2017 2340   NITRITE NEGATIVE 05/11/2017 2340   LEUKOCYTESUR NEGATIVE 05/11/2017 2340    Sepsis Labs: Lactic Acid, Venous No results found for: LATICACIDVEN  MICROBIOLOGY: No results found for this or any previous visit (from the past 240 hour(s)).  RADIOLOGY STUDIES/RESULTS: Dg Chest 2 View  Result Date: 05/11/2017 CLINICAL DATA:   Initial evaluation for acute bilateral chest pain. EXAM: CHEST  2 VIEW COMPARISON:  Prior radiograph from 05/11/2017. FINDINGS: Moderate cardiomegaly, stable. Mediastinal silhouette within normal limits. Lungs normally inflated. Mild diffuse vascular and interstitial prominence, suggesting mild pulmonary interstitial congestion. No frank alveolar edema. Superimposed streaky left basilar atelectasis. No consolidative airspace disease. No definite pleural effusion. No pneumothorax. No acute osseus abnormality. Degenerative changes present about the shoulders. IMPRESSION: 1. Cardiomegaly with mild diffuse pulmonary interstitial congestion without frank pulmonary edema. 2. Mild left basilar atelectasis.  No focal infiltrates identified. Electronically Signed   By: Rise Mu M.D.   On: 05/11/2017 14:01   Dg Chest 2 View  Result Date: 05/11/2017 CLINICAL DATA:  Cough, shortness of breath. EXAM: CHEST  2 VIEW COMPARISON:  None. FINDINGS: Mild cardiomegaly is noted with mild central pulmonary vascular congestion. No pneumothorax or pleural effusion is noted. Bony thorax is unremarkable. No consolidative process is noted. IMPRESSION: Mild cardiomegaly with mild central pulmonary vascular congestion. Electronically Signed   By: Lupita Raider, M.D.   On: 05/11/2017 11:39  LOS: 0 days   Jeoffrey Massed, MD  [facility] Pager:[phone]  If 7PM-7AM, please contact night-coverage [email or web address] Password [email or web address] 05/12/2017, 1:18 PM

## 2017-05-12 NOTE — Plan of Care (Signed)
Problem: Food- and Nutrition-Related Knowledge Deficit (NB-1.1) Goal: Nutrition education Formal process to instruct or train a patient/client in a skill or to impart knowledge to help patients/clients voluntarily manage or modify food choices and eating behavior to maintain or improve health. Outcome: Completed/Met Date Met: 05/12/17 Nutrition Education Note  RD consulted for nutrition education regarding CHF and lowering sodium in diet.  RD provided "Heart Failure Nutrition Therapy" handout from the Academy of [employer]etics. Reviewed patient's dietary recall.  Pt reports consuming frozen meals and eating at fast food restaurants often. Pt reports her daughter brought her fries with no salt added and they were "disgusting".  Provided examples on ways to decrease sodium intake in diet. Provided alternatives to season and flavor foods. Discouraged intake of processed foods and use of salt shaker.  Pt reports cooking with salt and food without it tastes "bland". Pt rinses canned foods.   Encouraged fresh fruits and vegetables as well as whole grain sources of carbohydrates to maximize fiber intake. Pt reports liking vegetables such as asparagus however likes them with cheese added.   RD discussed why it is important for patient to adhere to diet recommendations, and emphasized the role of fluids, foods to avoid, and importance of weighing self daily. Teach back method used.  Expect poor to fair compliance.  Body mass index is 33.57 kg/m. Pt meets criteria for obese based on current BMI.  Current diet order is heart healthy, patient is consuming approximately 100% of meals at this time. Labs and medications reviewed. No further nutrition interventions warranted at this time. RD contact information provided. If additional nutrition issues arise, please re-consult RD.   Parks Ranger, MS, RDN, LDN 05/12/2017 3:25 PM

## 2017-05-12 NOTE — Progress Notes (Signed)
Progress Note  Patient Name: Teresa [name] Date of Encounter: 05/12/2017  Primary Cardiologist: New to Dr. Eden Emms   Subjective   Breathing improved however not at baseline. No chest pain or palpitation.   Inpatient Medications    Scheduled Meds: . aspirin  325 mg Oral Daily  . enoxaparin (LOVENOX) injection  40 mg Subcutaneous Q24H  . furosemide  40 mg Intravenous BID  . Influenza vac split quadrivalent PF  0.5 mL Intramuscular Tomorrow-1000  . losartan  50 mg Oral Daily  . metoprolol succinate  25 mg Oral Daily  . nicotine  21 mg Transdermal Daily  . pneumococcal 23 valent vaccine  0.5 mL Intramuscular Tomorrow-1000   Continuous Infusions:  PRN Meds: acetaminophen **OR** acetaminophen, hydrALAZINE, ibuprofen, ondansetron **OR** ondansetron (ZOFRAN) IV, senna-docusate   Vital Signs    Vitals:   05/11/17 2013 05/11/17 2237 05/11/17 2356 05/12/17 0604  BP: (!) 175/97 (!) 158/87 (!) 178/98 (!) 169/96  Pulse: 89 87 87 83  Resp: Temp: 98.2 F (36.8 C)  98.2 F (36.8 C) 98.2 F (36.8 C)  TempSrc: Oral  Oral Oral  SpO2: 100%  99% 98%  Weight:    208 lb (94.3 kg)  Height:        Intake/Output Summary (Last 24 hours) at 05/12/17 0810 Last data filed at 05/12/17 0552  Gross per 24 hour  Intake              360 ml  Output             3700 ml  Net            -3340 ml   Filed Weights   05/11/17 1305 05/11/17 1752 05/12/17 0604  Weight: 216 lb (98 kg) 208 lb 14.4 oz (94.8 kg) 208 lb (94.3 kg)    Telemetry    Sinus rhythm at rate of 70-80, intermittently goes to low 40s - Personally Reviewed  ECG    SR with TWI in lead V4 to V6, TWI in lead V4 is new today- Personally Reviewed  Physical Exam   GEN: No acute distress.   Neck: + JVD Cardiac: RRR, no murmurs, rubs, or gallops.  Respiratory: Clear to auscultation bilaterally. GI: Soft, nontender, non-distended  MS: No edema; No deformity. Neuro:  Nonfocal  Psych: Normal affect   Labs      Chemistry Recent Labs Lab 05/11/17 1128 05/11/17 1323 05/12/17 0432  NA 141 137 139  K 4.5 4.1 3.7  CL 101 106 101  CO2 GLUCOSE 94 91 104*  BUN CREATININE 0.74 0.70 0.65  CALCIUM 9.3 8.9 8.9  PROT 7.1  --   --   ALBUMIN 4.5  --   --   AST 16  --   --   ALT 17  --   --   ALKPHOS 116  --   --   BILITOT 0.4  --   --   GFRNONAA 93 >60 >60  GFRAA 108 >60 >60  ANIONGAP  --  7 10     Hematology Recent Labs Lab 05/11/17 1128 05/11/17 1323 05/12/17 0432  WBC 6.2 4.9 4.8  RBC 4.32 4.00 4.29  HGB 12.0 11.0* 12.4  HCT 38.5 35.6* 38.5  MCV 89 89.0 89.7  MCH 27.8 27.5 28.9  MCHC 31.2* 30.9 32.2  RDW 14.5 14.5 14.5  PLT 243 196 228    Cardiac Enzymes Recent Labs Lab 05/11/17  1323 05/11/17 1752 05/11/17 2211 05/12/17 0432  TROPONINI 0.03* 0.03* 0.03* 0.03*   No results for input(s): TROPIPOC in the last 168 hours.   BNP Recent Labs Lab 05/11/17 1323  BNP 328.7*     DDimer No results for input(s): DDIMER in the last 168 hours.   Radiology    Dg Chest 2 View  Result Date: 05/11/2017 CLINICAL DATA:  Initial evaluation for acute bilateral chest pain. EXAM: CHEST  2 VIEW COMPARISON:  Prior radiograph from 05/11/2017. FINDINGS: Moderate cardiomegaly, stable. Mediastinal silhouette within normal limits. Lungs normally inflated. Mild diffuse vascular and interstitial prominence, suggesting mild pulmonary interstitial congestion. No frank alveolar edema. Superimposed streaky left basilar atelectasis. No consolidative airspace disease. No definite pleural effusion. No pneumothorax. No acute osseus abnormality. Degenerative changes present about the shoulders. IMPRESSION: 1. Cardiomegaly with mild diffuse pulmonary interstitial congestion without frank pulmonary edema. 2. Mild left basilar atelectasis.  No focal infiltrates identified. Electronically Signed   By: Rise Mu M.D.   On: 05/11/2017 14:01   Dg Chest 2 View  Result Date:  05/11/2017 CLINICAL DATA:  Cough, shortness of breath. EXAM: CHEST  2 VIEW COMPARISON:  None. FINDINGS: Mild cardiomegaly is noted with mild central pulmonary vascular congestion. No pneumothorax or pleural effusion is noted. Bony thorax is unremarkable. No consolidative process is noted. IMPRESSION: Mild cardiomegaly with mild central pulmonary vascular congestion. Electronically Signed   By: Lupita Raider, M.D.   On: 05/11/2017 11:39    Cardiac Studies   Pending echo   Patient Profile     51 y.o. female with hx of HTN however non compliant with meds and ongoing tobacco abuse presented to ER from PCP office for dyspnea, edema and abnormal EKG.   Assessment & Plan    1. HTN - Non compliant with medications. BP minimal improved. Continue Toprol XL at current dose of 25mg  qd given transient bradycardia. Up titrate Losartan as needed.    2. Dyspnea - BNP 328. Breathing improved on IV lasix however not at baseline. NET I & O negative 3.3L.  + JVD. Continue IV lasix 40mg  this AM. Can change to PO in evening.  - Advise cut back on salt intake. Pending echo. Likely hypertensive heart disease.   3. Tobacco smoking -Advised cessation. Education given. On nicotine patch.    For questions or updates, please contact [facility] Please consult [email or web address] for contact info under Cardiology/STEMI.      Signed, Manson Passey, PA  05/12/2017, 8:10 AM    Patient examined chart reviewed Exam with obese black female S4 gallop trace LE edema continue to titrate BP meds. Echo pending to see if component of diastolic or systolic dysfunction increase ARB  Charlton Haws

## 2017-05-12 NOTE — Care Management Note (Signed)
Case Management Note  Patient Details  Name: Teresa [name] MRN: [id number] Date of Birth: [birth year]  Subjective/Objective:   Dyspnea                Action/Plan: Patient is independent of all of her ADL's; works full time; no PCP, she goes to Urgent Care; she is agreeable for CM to assist her in finding a PCP; apt made with Dr Abbe Amsterdam with [facility] for primary care for Oct 22,2018 at 1:30 pm; has private insurance with [facility] with prescription drug coverage; pharmacy of choice is [facility]; CM talked to patient about lifestyle changes, pt is slightly open to this - diet, smoking, drinking; CM will continue to follow for DCP.  Expected Discharge Date:   possibly 05/15/2017               Expected Discharge Plan:  Home/Self Care  In-House Referral:   [facility]  Discharge planning Services  CM Consult, Follow-up appt scheduled  Status of Service:  In process, will continue to follow  Reola Mosher [phone] 05/12/2017, 11:50 AM

## 2017-05-13 DIAGNOSIS — R0601 Orthopnea: Secondary | ICD-10-CM

## 2017-05-13 DIAGNOSIS — I509 Heart failure, unspecified: Secondary | ICD-10-CM

## 2017-05-13 DIAGNOSIS — R0602 Shortness of breath: Secondary | ICD-10-CM

## 2017-05-13 DIAGNOSIS — Z720 Tobacco use: Secondary | ICD-10-CM

## 2017-05-13 DIAGNOSIS — I5043 Acute on chronic combined systolic (congestive) and diastolic (congestive) heart failure: Secondary | ICD-10-CM

## 2017-05-13 DIAGNOSIS — I10 Essential (primary) hypertension: Secondary | ICD-10-CM

## 2017-05-13 DIAGNOSIS — R0600 Dyspnea, unspecified: Secondary | ICD-10-CM

## 2017-05-13 DIAGNOSIS — R600 Localized edema: Secondary | ICD-10-CM

## 2017-05-13 LAB — BASIC METABOLIC PANEL
Anion gap: 9 (ref 5–15)
BUN: 18 mg/dL (ref 6–20)
CHLORIDE: 101 mmol/L (ref 101–111)
CO2: 29 mmol/L (ref 22–32)
CREATININE: 0.7 mg/dL (ref 0.44–1.00)
Calcium: 9.1 mg/dL (ref 8.9–10.3)
GFR calc Af Amer: >60 mL/min (ref >60)
GFR calc non Af Amer: >60 mL/min (ref >60)
GLUCOSE: 118 mg/dL — AB (ref 65–99)
POTASSIUM: 4 mmol/L (ref 3.5–5.1)
Sodium: 139 mmol/L (ref 135–145)

## 2017-05-13 NOTE — Progress Notes (Signed)
Provided pt with living with heart failure book, pt does not want to complete teaching at this time Informed pt to read it over independently and RN will come back for teaching at lunch

## 2017-05-13 NOTE — Progress Notes (Signed)
Provided living with heart failure booklet to pt and went over teaching at bedside  Pt understanding and verbalized teach back

## 2017-05-13 NOTE — Progress Notes (Signed)
PROGRESS NOTE    Teresa [name]  MCN:[id number] DOB: 1965-10-03 DOA: 05/11/2017 PCP: Patient, No Pcp Per   Chief Complaint  Patient presents with  . Shortness of Breath     Brief Narrative:  HPI on 05/11/2017 by Dr. Shelly Flatten Teresa [name] is a 50 y.o. female with medical history significant hypertension, noncompliance, tobacco use since emergency department from her primary care physician's office chief complaint of shortness of breath, lower extremity edema. PCP concerned about abnormal EKG. Initial evaluation concerning for new onset heart failure. [facility] asked to admit. Information is obtained from the patient. She states she was diagnosed with hypertension more than 10 years ago. She has not taken any medications for this in almost 2 years as she "stopped going to the doctor". She states over the last month she has developed worsening dyspnea with exertion lower extremity edema and orthopnea. She denies any chest pain palpitations headache dizziness syncope or near-syncope. She denies abdominal pain nausea vomiting dysuria hematuria frequency or urgency. She admits to some continuing to smoke but denies any diagnosis of COPD or asthma. Assessment & Plan   Dyspnea secondary to Acute combined systolic/diastolic heart failure -Feels breathing has improved -CXR on admission: Pulmonary interstitial congestion -BNP 328.7 -Echocardiogram shows EF of 25-30%, severe diffuse hypokinesis, grade 2 diastolic dysfunction -Cardiology consulted and appreciated -Discussed with Dr. Eden Emms, will continue IV diuresis today, likely transition to oral Lasix on 05/14/2017 with possible discharge. -Continue to monitor intake and output, daily weights -Urine output over the past 24 hours 3100 mL  Uncontrolled hypertension -Continue losartan, Lasix, metoprolol  Tobacco abuse -discussed cessation -continue nicotine patch  DVT Prophylaxis  Lovenox  Code Status: Full  Family  Communication: None at bedside  Disposition Plan: Admitted. Likely discharged home in the next 1-2 days  Consultants Cardiology  Procedures  Echocardiogram  Antibiotics   Anti-infectives    None      Subjective:   Teresa [name] seen and examined today.  Patient feels breathing has improved along with lower extremity swelling. Denies any current chest pain, abdominal pain, nausea vomiting, diarrhea or constipation. Does complain of headache but cannot elaborate on her headache.   Objective:   Vitals:   05/12/17 1230 05/12/17 1952 05/13/17 0504 05/13/17 0804  BP: (!) 138/96 (!) 149/87 (!) 158/90 (!) 155/83  Pulse: 88 86 82 81  Resp: 20 18 18 18   Temp: 98.2 F (36.8 C) 98.1 F (36.7 C) 97.7 F (36.5 C)   TempSrc: Oral Oral Oral   SpO2: 99% 98% 98% 99%  Weight:   94.5 kg (208 lb 6.4 oz)   Height:        Intake/Output Summary (Last 24 hours) at 05/13/17 1200 Last data filed at 05/13/17 0500  Gross per 24 hour  Intake              960 ml  Output             2100 ml  Net            -1140 ml   Filed Weights   05/11/17 1752 05/12/17 0604 05/13/17 0504  Weight: 94.8 kg (208 lb 14.4 oz) 94.3 kg (208 lb) 94.5 kg (208 lb 6.4 oz)   Exam  General: Well developed, well nourished, NAD, appears stated age  HEENT: NCAT,  mucous membranes moist.   Cardiovascular: S1 S2 auscultated, RRR, no murmurs  Respiratory: Clear to auscultation bilaterally with equal chest rise  Abdomen: Soft, nontender, nondistended, +  bowel sounds  Extremities: warm dry without cyanosis clubbing or edema  Neuro: AAOx3, nonfocal  Psych:Uninterested   Data Reviewed: I have personally reviewed following labs and imaging studies  CBC:  Recent Labs Lab 05/11/17 1128 05/11/17 1323 05/12/17 0432  WBC 6.2 4.9 4.8  NEUTROABS  --  2.4  --   HGB 12.0 11.0* 12.4  HCT 38.5 35.6* 38.5  MCV 89 89.0 89.7  PLT 243 196 228   Basic Metabolic Panel:  Recent Labs Lab 05/11/17 1128 05/11/17 1323  05/12/17 0432 05/13/17 0232  NA 141 137 139 139  K 4.5 4.1 3.7 4.0  CL 101 106 101 101  CO2 GLUCOSE 94 91 104* 118*  BUN CREATININE 0.74 0.70 0.65 0.70  CALCIUM 9.3 8.9 8.9 9.1   GFR: Estimated Creatinine Clearance: 95.3 mL/min (by C-G formula based on SCr of 0.7 mg/dL). Liver Function Tests:  Recent Labs Lab 05/11/17 1128  AST 16  ALT 17  ALKPHOS 116  BILITOT 0.4  PROT 7.1  ALBUMIN 4.5   No results for input(s): LIPASE, AMYLASE in the last 168 hours. No results for input(s): AMMONIA in the last 168 hours. Coagulation Profile: No results for input(s): INR, PROTIME in the last 168 hours. Cardiac Enzymes:  Recent Labs Lab 05/11/17 1323 05/11/17 1752 05/11/17 2211 05/12/17 0432  TROPONINI 0.03* 0.03* 0.03* 0.03*   BNP (last 3 results) No results for input(s): PROBNP in the last 8760 hours. HbA1C: No results for input(s): HGBA1C in the last 72 hours. CBG: No results for input(s): GLUCAP in the last 168 hours. Lipid Profile:  Recent Labs  05/11/17 1752  CHOL 140  HDL 56  LDLCALC 60  TRIG 120  CHOLHDL 2.5   Thyroid Function Tests: No results for input(s): TSH, T4TOTAL, FREET4, T3FREE, THYROIDAB in the last 72 hours. Anemia Panel: No results for input(s): VITAMINB12, FOLATE, FERRITIN, TIBC, IRON, RETICCTPCT in the last 72 hours. Urine analysis:    Component Value Date/Time   COLORURINE STRAW (A) 05/11/2017 2340   APPEARANCEUR CLEAR 05/11/2017 2340   LABSPEC 1.006 05/11/2017 2340   PHURINE 6.0 05/11/2017 2340   GLUCOSEU NEGATIVE 05/11/2017 2340   HGBUR NEGATIVE 05/11/2017 2340   BILIRUBINUR NEGATIVE 05/11/2017 2340   KETONESUR NEGATIVE 05/11/2017 2340   PROTEINUR NEGATIVE 05/11/2017 2340   NITRITE NEGATIVE 05/11/2017 2340   LEUKOCYTESUR NEGATIVE 05/11/2017 2340   Sepsis Labs: (procalcitonin:4,lacticidven:4)  )No results found for this or any previous visit (from the past 240 hour(s)).    Radiology Studies: Dg  Chest 2 View  Result Date: 05/11/2017 CLINICAL DATA:  Initial evaluation for acute bilateral chest pain. EXAM: CHEST  2 VIEW COMPARISON:  Prior radiograph from 05/11/2017. FINDINGS: Moderate cardiomegaly, stable. Mediastinal silhouette within normal limits. Lungs normally inflated. Mild diffuse vascular and interstitial prominence, suggesting mild pulmonary interstitial congestion. No frank alveolar edema. Superimposed streaky left basilar atelectasis. No consolidative airspace disease. No definite pleural effusion. No pneumothorax. No acute osseus abnormality. Degenerative changes present about the shoulders. IMPRESSION: 1. Cardiomegaly with mild diffuse pulmonary interstitial congestion without frank pulmonary edema. 2. Mild left basilar atelectasis.  No focal infiltrates identified. Electronically Signed   By: Rise Mu M.D.   On: 05/11/2017 14:01     Scheduled Meds: . aspirin  325 mg Oral Daily  . enoxaparin (LOVENOX) injection  40 mg Subcutaneous Q24H  . furosemide  40 mg Intravenous BID  . Influenza vac split quadrivalent PF  0.5 mL Intramuscular  Tomorrow-1000  . losartan  100 mg Oral Daily  . metoprolol succinate  25 mg Oral Daily  . nicotine  21 mg Transdermal Daily  . pneumococcal 23 valent vaccine  0.5 mL Intramuscular Tomorrow-1000   Continuous Infusions:   LOS: 1 day   Time Spent in minutes   30 minutes  Wrigley Plasencia D.O. on 05/13/2017 at 12:00 PM  Between 7am to 7pm - Pager - [phone]  After 7pm go to [email or web address] - password [email or web address]  And look for the night coverage person covering for me after hours  [facility] Office  [phone]

## 2017-05-13 NOTE — Progress Notes (Signed)
Progress Note  Patient Name: Teresa [name] Date of Encounter: 05/13/2017  Primary Cardiologist: New to Dr. Eden Emms   Subjective   Better no dyspnea    Inpatient Medications    Scheduled Meds: . aspirin  325 mg Oral Daily  . enoxaparin (LOVENOX) injection  40 mg Subcutaneous Q24H  . furosemide  40 mg Intravenous BID  . Influenza vac split quadrivalent PF  0.5 mL Intramuscular Tomorrow-1000  . losartan  100 mg Oral Daily  . metoprolol succinate  25 mg Oral Daily  . nicotine  21 mg Transdermal Daily  . pneumococcal 23 valent vaccine  0.5 mL Intramuscular Tomorrow-1000   Continuous Infusions:  PRN Meds: acetaminophen **OR** acetaminophen, hydrALAZINE, ibuprofen, ondansetron **OR** ondansetron (ZOFRAN) IV, senna-docusate   Vital Signs    Vitals:   05/12/17 1230 05/12/17 1952 05/13/17 0504 05/13/17 0804  BP: (!) 138/96 (!) 149/87 (!) 158/90 (!) 155/83  Pulse: 88 86 82 81  Resp: Temp: 98.2 F (36.8 C) 98.1 F (36.7 C) 97.7 F (36.5 C)   TempSrc: Oral Oral Oral   SpO2: 99% 98% 98% 99%  Weight:   208 lb 6.4 oz (94.5 kg)   Height:        Intake/Output Summary (Last 24 hours) at 05/13/17 0845 Last data filed at 05/13/17 0500  Gross per 24 hour  Intake              960 ml  Output             3100 ml  Net            -2140 ml   Filed Weights   05/11/17 1752 05/12/17 0604 05/13/17 0504  Weight: 208 lb 14.4 oz (94.8 kg) 208 lb (94.3 kg) 208 lb 6.4 oz (94.5 kg)    Telemetry    Sinus rhythm at rate of 70-80, intermittently goes to low 40s - Personally Reviewed  ECG    SR with TWI in lead V4 to V6, TWI in lead V4 is new today- Personally Reviewed  Physical Exam   Affect appropriate Overweight black female  HEENT: normal Neck supple with no adenopathy JVP normal no bruits no thyromegaly Lungs clear with no wheezing and good diaphragmatic motion Heart:  S1/S2 no murmur, S4 gallop  PMI normal Abdomen: benighn, BS positve, no tenderness, no  AAA no bruit.  No HSM or HJR Distal pulses intact with no bruits No edema Neuro non-focal Skin warm and dry No muscular weakness   Labs    Chemistry  Recent Labs Lab 05/11/17 1128 05/11/17 1323 05/12/17 0432 05/13/17 0232  NA 141 137 139 139  K 4.5 4.1 3.7 4.0  CL 101 106 101 101  CO2 GLUCOSE 94 91 104* 118*  BUN CREATININE 0.74 0.70 0.65 0.70  CALCIUM 9.3 8.9 8.9 9.1  PROT 7.1  --   --   --   ALBUMIN 4.5  --   --   --   AST 16  --   --   --   ALT 17  --   --   --   ALKPHOS 116  --   --   --   BILITOT 0.4  --   --   --   GFRNONAA 93 >60 >60 >60  GFRAA 108 >60 >60 >60  ANIONGAP  --  Hematology  Recent Labs Lab  05/11/17 1128 05/11/17 1323 05/12/17 0432  WBC 6.2 4.9 4.8  RBC 4.32 4.00 4.29  HGB 12.0 11.0* 12.4  HCT 38.5 35.6* 38.5  MCV 89 89.0 89.7  MCH 27.8 27.5 28.9  MCHC 31.2* 30.9 32.2  RDW 14.5 14.5 14.5  PLT 243 196 228    Cardiac Enzymes  Recent Labs Lab 05/11/17 1323 05/11/17 1752 05/11/17 2211 05/12/17 0432  TROPONINI 0.03* 0.03* 0.03* 0.03*   No results for input(s): TROPIPOC in the last 168 hours.   BNP  Recent Labs Lab 05/11/17 1323  BNP 328.7*     DDimer No results for input(s): DDIMER in the last 168 hours.   Radiology    Dg Chest 2 View  Result Date: 05/11/2017 CLINICAL DATA:  Initial evaluation for acute bilateral chest pain. EXAM: CHEST  2 VIEW COMPARISON:  Prior radiograph from 05/11/2017. FINDINGS: Moderate cardiomegaly, stable. Mediastinal silhouette within normal limits. Lungs normally inflated. Mild diffuse vascular and interstitial prominence, suggesting mild pulmonary interstitial congestion. No frank alveolar edema. Superimposed streaky left basilar atelectasis. No consolidative airspace disease. No definite pleural effusion. No pneumothorax. No acute osseus abnormality. Degenerative changes present about the shoulders. IMPRESSION: 1. Cardiomegaly with mild diffuse pulmonary  interstitial congestion without frank pulmonary edema. 2. Mild left basilar atelectasis.  No focal infiltrates identified. Electronically Signed   By: Rise Mu M.D.   On: 05/11/2017 14:01   Dg Chest 2 View  Result Date: 05/11/2017 CLINICAL DATA:  Cough, shortness of breath. EXAM: CHEST  2 VIEW COMPARISON:  None. FINDINGS: Mild cardiomegaly is noted with mild central pulmonary vascular congestion. No pneumothorax or pleural effusion is noted. Bony thorax is unremarkable. No consolidative process is noted. IMPRESSION: Mild cardiomegaly with mild central pulmonary vascular congestion. Electronically Signed   By: Lupita Raider, M.D.   On: 05/11/2017 11:39    Cardiac Studies   EF 25-30% mild MR   Patient Profile     51 y.o. female with hx of HTN however non compliant with meds and ongoing tobacco abuse presented to ER from PCP office for dyspnea, edema and abnormal EKG. Echo shows diffuse hypokinesis with EF 25-30%   Assessment & Plan    1. HTN - Non compliant with medications. BP minimal improved. Continue Toprol XL at current dose of 25mg  qd given transient bradycardia. Up titrate Losartan as needed.    2. Dyspnea - likely non ischemic DCM titrate meds does not need cath this admission needs f/u echo after 3 months Rx and noted compliance with meds  Continue iv lasix ARB and beta blocker   3. Tobacco smoking -Advised cessation. Education given. On nicotine patch.

## 2017-05-14 DIAGNOSIS — R9431 Abnormal electrocardiogram [ECG] [EKG]: Secondary | ICD-10-CM

## 2017-05-14 LAB — BASIC METABOLIC PANEL
ANION GAP: 9 (ref 5–15)
BUN: 20 mg/dL (ref 6–20)
CALCIUM: 9.2 mg/dL (ref 8.9–10.3)
CO2: 30 mmol/L (ref 22–32)
Chloride: 100 mmol/L — ABNORMAL LOW (ref 101–111)
Creatinine, Ser: 0.65 mg/dL (ref 0.44–1.00)
GFR calc Af Amer: >60 mL/min (ref >60)
GLUCOSE: 112 mg/dL — AB (ref 65–99)
Potassium: 4.2 mmol/L (ref 3.5–5.1)
Sodium: 139 mmol/L (ref 135–145)

## 2017-05-14 MED ORDER — ASPIRIN 81 MG PO TABS: 325.0000 mg | ORAL_TABLET | Freq: Every day | ORAL | 0 refills | Status: DC

## 2017-05-14 MED ORDER — FUROSEMIDE 40 MG PO TABS
40.0000 mg | ORAL_TABLET | Freq: Every day | ORAL | Status: DC
  Administered 2017-05-14: 40 mg via ORAL
  Filled 2017-05-14: qty 1

## 2017-05-14 MED ORDER — METOPROLOL SUCCINATE ER 25 MG PO TB24: 25.0000 mg | ORAL_TABLET | Freq: Every day | ORAL | 0 refills | Status: DC

## 2017-05-14 MED ORDER — NICOTINE 21 MG/24HR TD PT24: 21.0000 mg | MEDICATED_PATCH | Freq: Every day | TRANSDERMAL | 0 refills | Status: DC

## 2017-05-14 MED ORDER — LOSARTAN POTASSIUM 100 MG PO TABS
100.0000 mg | ORAL_TABLET | Freq: Every day | ORAL | 0 refills | Status: DC
Start: 2017-05-14 — End: 2017-06-15

## 2017-05-14 NOTE — Progress Notes (Signed)
Pt discharged home IV and tele removed. Discharge instructions given. Questions answered.  

## 2017-05-14 NOTE — Discharge Summary (Signed)
Physician Discharge Summary  LEAIRAH COONROD GNO:[id number] DOB: 1965-10-27 DOA: 05/11/2017  PCP: Patient, No Pcp Per  Admit date: 05/11/2017 Discharge date: 05/14/2017  Time spent: 45 minutes  Recommendations for Outpatient Follow-up:  Patient will be discharged to home.  Patient will need to follow up with primary care provider within one week of discharge.  Follow up with cardiology, Dr. Eden Emms, in 3 weeks. Patient should continue medications as prescribed.  Patient should follow a heart healthy diet.   Discharge Diagnoses:  Dyspnea secondary to Acute combined systolic/diastolic heart failure Uncontrolled hypertension Tobacco abuse  Discharge Condition: Stable   Diet recommendation: Heart healthy  Filed Weights   05/12/17 0604 05/13/17 0504 05/14/17 0610  Weight: 94.3 kg (208 lb) 94.5 kg (208 lb 6.4 oz) 94.8 kg (209 lb 1.6 oz)    History of present illness:  on 05/11/2017 by Dr. Ellan Lambert Betheais a 51 y.o.femalewith medical history significant hypertension, noncompliance, tobacco use since emergency department from her primary care physician's office chief complaint of shortness of breath, lower extremity edema. PCP concerned about abnormal EKG. Initial evaluation concerning for new onset heart failure. [facility] asked to admit. Information is obtained from the patient. She states she was diagnosed with hypertension more than 10 years ago. She has not taken any medications for this in almost 2 years as she "stopped going to the doctor". She states over the last month she has developed worsening dyspnea with exertion lower extremity edema and orthopnea. She denies any chest pain palpitations headache dizziness syncope or near-syncope. She denies abdominal pain nausea vomiting dysuria hematuria frequency or urgency. She admits to some continuing to smoke but denies any diagnosis of COPD or asthma.  Hospital Course:  Dyspnea secondary to Acute combined  systolic/diastolic heart failure -Feels breathing has improved -CXR on admission: Pulmonary interstitial congestion -BNP 328.7 -Echocardiogram shows EF of 25-30%, severe diffuse hypokinesis, grade 2 diastolic dysfunction -Cardiology consulted and appreciated -Discussed with Dr. Eden Emms, will continue IV diuresis today, likely transition to oral Lasix on 05/14/2017 with possible discharge. -Continue to monitor intake and output, daily weights -Urine output over the past 24 hours 2800 mL  Uncontrolled hypertension -Continue losartan, Lasix, metoprolol  Tobacco abuse -discussed cessation -continue nicotine patch  Procedures:  Echocardiogram  Consultations:  Cardiology  Discharge Exam: Vitals:   05/13/17 1954 05/14/17 0610  BP: 128/77 (!) 144/86  Pulse: 91 81  Resp: 18 18  Temp: 98.6 F (37 C) (!) 97.5 F (36.4 C)  SpO2: 100% 99%   Denies chest pain, shortness of breath, abdominal pain, N/V/D/C, dizziness, headache. Would like to go home.    General: Well developed, well nourished, NAD, appears stated age  HEENT: NCAT, mucous membranes moist.  Cardiovascular: S1 S2 auscultated, RRR, no murmurs  Respiratory: Clear to auscultation bilaterally with equal chest rise  Abdomen: Soft, obese, nontender, nondistended, + bowel sounds  Extremities: warm dry without cyanosis clubbing or edema  Neuro: AAOx3, nonfocal  Psych: Pleasant and appropriate  Discharge Instructions Discharge Instructions    Discharge instructions    Complete by:  As directed    Patient will be discharged to home.  Patient will need to follow up with primary care provider within one week of discharge.  Follow up with cardiology, Dr. Eden Emms, in 3 weeks. Patient should continue medications as prescribed.  Patient should follow a heart healthy diet.     Current Discharge Medication List    START taking these medications   Details  aspirin 81  MG tablet Take 4 tablets (325 mg total) by mouth  daily. Qty: 30 tablet, Refills: 0    losartan (COZAAR) 100 MG tablet Take 1 tablet (100 mg total) by mouth daily. Qty: 30 tablet, Refills: 0    metoprolol succinate (TOPROL-XL) 25 MG 24 hr tablet Take 1 tablet (25 mg total) by mouth daily. Qty: 30 tablet, Refills: 0    nicotine (NICODERM CQ - DOSED IN MG/24 HOURS) 21 mg/24hr patch Place 1 patch (21 mg total) onto the skin daily. Qty: 28 patch, Refills: 0      CONTINUE these medications which have NOT CHANGED   Details  ibuprofen (ADVIL,MOTRIN) 200 MG tablet Take 600 mg by mouth every 8 (eight) hours as needed for moderate pain.    Pseudoephedrine-APAP-DM (DAYQUIL PO) Take 2 capsules by mouth as needed.       Not on File Follow-up Information    [name], MD Follow up on 05/18/2017.   Specialty:  Family Medicine Why:  at 1:30 pm; please try to keep your apt or call to reschedule Contact information: [address] [place] Flake [facility] [place] [address] [phone]        Wendall Stade, MD. Schedule an appointment as soon as possible for a visit in 3 week(s).   Specialty:  Cardiology Why:  Hospital follow up Contact information: [address] [place] [address] [phone]            The results of significant diagnostics from this hospitalization (including imaging, microbiology, ancillary and laboratory) are listed below for reference.    Significant Diagnostic Studies: Dg Chest 2 View  Result Date: 05/11/2017 CLINICAL DATA:  Initial evaluation for acute bilateral chest pain. EXAM: CHEST  2 VIEW COMPARISON:  Prior radiograph from 05/11/2017. FINDINGS: Moderate cardiomegaly, stable. Mediastinal silhouette within normal limits. Lungs normally inflated. Mild diffuse vascular and interstitial prominence, suggesting mild pulmonary interstitial congestion. No frank alveolar edema. Superimposed streaky left basilar atelectasis. No consolidative airspace disease. No definite pleural effusion. No  pneumothorax. No acute osseus abnormality. Degenerative changes present about the shoulders. IMPRESSION: 1. Cardiomegaly with mild diffuse pulmonary interstitial congestion without frank pulmonary edema. 2. Mild left basilar atelectasis.  No focal infiltrates identified. Electronically Signed   By: Rise Mu M.D.   On: 05/11/2017 14:01   Dg Chest 2 View  Result Date: 05/11/2017 CLINICAL DATA:  Cough, shortness of breath. EXAM: CHEST  2 VIEW COMPARISON:  None. FINDINGS: Mild cardiomegaly is noted with mild central pulmonary vascular congestion. No pneumothorax or pleural effusion is noted. Bony thorax is unremarkable. No consolidative process is noted. IMPRESSION: Mild cardiomegaly with mild central pulmonary vascular congestion. Electronically Signed   By: Lupita Raider, M.D.   On: 05/11/2017 11:39    Microbiology: No results found for this or any previous visit (from the past 240 hour(s)).   Labs: Basic Metabolic Panel:  Recent Labs Lab 05/11/17 1128 05/11/17 1323 05/12/17 0432 05/13/17 0232 05/14/17 0502  NA 141 137 139 139 139  K 4.5 4.1 3.7 4.0 4.2  CL 101 106 101 101 100*  CO2 GLUCOSE 94 91 104* 118* 112*  BUN CREATININE 0.74 0.70 0.65 0.70 0.65  CALCIUM 9.3 8.9 8.9 9.1 9.2   Liver Function Tests:  Recent Labs Lab 05/11/17 1128  AST 16  ALT 17  ALKPHOS 116  BILITOT 0.4  PROT 7.1  ALBUMIN 4.5   No results for input(s):  LIPASE, AMYLASE in the last 168 hours. No results for input(s): AMMONIA in the last 168 hours. CBC:  Recent Labs Lab 05/11/17 1128 05/11/17 1323 05/12/17 0432  WBC 6.2 4.9 4.8  NEUTROABS  --  2.4  --   HGB 12.0 11.0* 12.4  HCT 38.5 35.6* 38.5  MCV 89 89.0 89.7  PLT 243 196 228   Cardiac Enzymes:  Recent Labs Lab 05/11/17 1323 05/11/17 1752 05/11/17 2211 05/12/17 0432  TROPONINI 0.03* 0.03* 0.03* 0.03*   BNP: BNP (last 3 results)  Recent Labs  05/11/17 1323  BNP 328.7*    ProBNP  (last 3 results) No results for input(s): PROBNP in the last 8760 hours.  CBG: No results for input(s): GLUCAP in the last 168 hours.     SignedEdsel Petrin  [facility] 05/14/2017, 9:04 AM

## 2017-05-14 NOTE — Progress Notes (Signed)
Progress Note  Patient Name: Teresa [name] Date of Encounter: 05/14/2017  Primary Cardiologist: New to Dr. Eden Emms   Subjective   Better no dyspnea  Wants to go home Discussed importance of compliance  Inpatient Medications    Scheduled Meds: . aspirin  325 mg Oral Daily  . enoxaparin (LOVENOX) injection  40 mg Subcutaneous Q24H  . furosemide  40 mg Intravenous BID  . Influenza vac split quadrivalent PF  0.5 mL Intramuscular Tomorrow-1000  . losartan  100 mg Oral Daily  . metoprolol succinate  25 mg Oral Daily  . nicotine  21 mg Transdermal Daily  . pneumococcal 23 valent vaccine  0.5 mL Intramuscular Tomorrow-1000   Continuous Infusions:  PRN Meds: acetaminophen **OR** acetaminophen, hydrALAZINE, ibuprofen, ondansetron **OR** ondansetron (ZOFRAN) IV, senna-docusate   Vital Signs    Vitals:   05/13/17 1300 05/13/17 1635 05/13/17 1954 05/14/17 0610  BP: 136/80 (!) 151/80 128/77 (!) 144/86  Pulse: 84 89 91 81  Resp: 20 18 18 18   Temp: 98 F (36.7 C)  98.6 F (37 C) (!) 97.5 F (36.4 C)  TempSrc: Oral  Oral Oral  SpO2: 100% 100% 100% 99%  Weight:    209 lb 1.6 oz (94.8 kg)  Height:        Intake/Output Summary (Last 24 hours) at 05/14/17 0827 Last data filed at 05/14/17 0500  Gross per 24 hour  Intake              720 ml  Output             2800 ml  Net            -2080 ml   Filed Weights   05/12/17 0604 05/13/17 0504 05/14/17 0610  Weight: 208 lb (94.3 kg) 208 lb 6.4 oz (94.5 kg) 209 lb 1.6 oz (94.8 kg)    Telemetry    Sinus rhythm at rate of 70's 05/14/2017  - Personally Reviewed  ECG    SR with TWI in lead V4 to V6, TWI in lead V4 is new today- Personally Reviewed  Physical Exam   Affect appropriate Healthy:  appears stated age HEENT: normal Neck supple with no adenopathy JVP normal no bruits no thyromegaly Lungs clear with no wheezing and good diaphragmatic motion Heart:  S1/S2 no murmur, no rub, gallop or click PMI normal Abdomen:  benighn, BS positve, no tenderness, no AAA no bruit.  No HSM or HJR Distal pulses intact with no bruits No edema Neuro non-focal Skin warm and dry No muscular weakness    Labs    Chemistry  Recent Labs Lab 05/11/17 1128  05/12/17 0432 05/13/17 0232 05/14/17 0502  NA 141  < > 139 139 139  K 4.5  < > 3.7 4.0 4.2  CL 101  < > 101 101 100*  CO2 23  < > 28 29 30   GLUCOSE 94  < > 104* 118* 112*  BUN 8  < > 10 18 20   CREATININE 0.74  < > 0.65 0.70 0.65  CALCIUM 9.3  < > 8.9 9.1 9.2  PROT 7.1  --   --   --   --   ALBUMIN 4.5  --   --   --   --   AST 16  --   --   --   --   ALT 17  --   --   --   --   ALKPHOS 116  --   --   --   --  BILITOT 0.4  --   --   --   --   GFRNONAA 93  < > >60 >60 >60  GFRAA 108  < > >60 >60 >60  ANIONGAP  --   < > < > = values in this interval not displayed.   Hematology  Recent Labs Lab 05/11/17 1128 05/11/17 1323 05/12/17 0432  WBC 6.2 4.9 4.8  RBC 4.32 4.00 4.29  HGB 12.0 11.0* 12.4  HCT 38.5 35.6* 38.5  MCV 89 89.0 89.7  MCH 27.8 27.5 28.9  MCHC 31.2* 30.9 32.2  RDW 14.5 14.5 14.5  PLT 243 196 228    Cardiac Enzymes  Recent Labs Lab 05/11/17 1323 05/11/17 1752 05/11/17 2211 05/12/17 0432  TROPONINI 0.03* 0.03* 0.03* 0.03*   No results for input(s): TROPIPOC in the last 168 hours.   BNP  Recent Labs Lab 05/11/17 1323  BNP 328.7*     DDimer No results for input(s): DDIMER in the last 168 hours.   Radiology    No results found.  Cardiac Studies   EF 25-30% mild MR   Patient Profile     51 y.o. female with hx of HTN however non compliant with meds and ongoing tobacco abuse presented to ER from PCP office for dyspnea, edema and abnormal EKG. Echo shows diffuse hypokinesis with EF 25-30%   Assessment & Plan    1. HTN - Non compliant with medications. BP minimal improved. Continue Toprol XL at current dose of  qd given transient bradycardia. Up titrate Losartan as needed.    2. Dyspnea -  likely non ischemic DCM titrate meds does not need cath this admission needs f/u echo after 3 months Rx and noted compliance with meds  Continue iv lasix ARB and beta blocker   3. Tobacco smoking -Advised cessation. Education given. On nicotine patch.   Will arrange outpatient f/u with [place]

## 2017-05-14 NOTE — Discharge Instructions (Signed)
Steps to Quit Smoking Smoking tobacco can be bad for your health. It can also affect almost every organ in your body. Smoking puts you and people around you at risk for many serious long-lasting (chronic) diseases. Quitting smoking is hard, but it is one of the best things that you can do for your health. It is never too late to quit. What are the benefits of quitting smoking? When you quit smoking, you lower your risk for getting serious diseases and conditions. They can include:  Lung cancer or lung disease.  Heart disease.  Stroke.  Heart attack.  Not being able to have children (infertility).  Weak bones (osteoporosis) and broken bones (fractures).  If you have coughing, wheezing, and shortness of breath, those symptoms may get better when you quit. You may also get sick less often. If you are pregnant, quitting smoking can help to lower your chances of having a baby of low birth weight. What can I do to help me quit smoking? Talk with your doctor about what can help you quit smoking. Some things you can do (strategies) include:  Quitting smoking totally, instead of slowly cutting back how much you smoke over a period of time.  Going to in-person counseling. You are more likely to quit if you go to many counseling sessions.  Using resources and support systems, such as: ? Database administrator with a Social worker. ? Phone quitlines. ? Careers information officer. ? Support groups or group counseling. ? Text messaging programs. ? Mobile phone apps or applications.  Taking medicines. Some of these medicines may have nicotine in them. If you are pregnant or breastfeeding, do not take any medicines to quit smoking unless your doctor says it is okay. Talk with your doctor about counseling or other things that can help you.  Talk with your doctor about using more than one strategy at the same time, such as taking medicines while you are also going to in-person counseling. This can help make  quitting easier. What things can I do to make it easier to quit? Quitting smoking might feel very hard at first, but there is a lot that you can do to make it easier. Take these steps:  Talk to your family and friends. Ask them to support and encourage you.  Call phone quitlines, reach out to support groups, or work with a Social worker.  Ask people who smoke to not smoke around you.  Avoid places that make you want (trigger) to smoke, such as: ? Bars. ? Parties. ? Smoke-break areas at work.  Spend time with people who do not smoke.  Lower the stress in your life. Stress can make you want to smoke. Try these things to help your stress: ? Getting regular exercise. ? Deep-breathing exercises. ? Yoga. ? Meditating. ? Doing a body scan. To do this, close your eyes, focus on one area of your body at a time from head to toe, and notice which parts of your body are tense. Try to relax the muscles in those areas.  Download or buy apps on your mobile phone or tablet that can help you stick to your quit plan. There are many free apps, such as QuitGuide from the [employer] Office manager for Disease Control and Prevention). You can find more support from smokefree.gov and other websites.  This information is not intended to replace advice given to you by your health care provider. Make sure you discuss any questions you have with your health care provider. Document Released: 05/21/2009 Document  Revised: 03/22/2016 Document Reviewed: 12/09/2014 Elsevier Interactive Patient Education  2018 [employer] Heart Failure Heart failure means your heart has trouble pumping blood. This makes it hard for your body to work well. Heart failure is usually a long-term (chronic) condition. You must take good care of yourself and follow your doctor's treatment plan. Follow these instructions at home:  Take your heart medicine as told by your doctor. ? Do not stop taking medicine unless your doctor tells you to. ? Do not  skip any dose of medicine. ? Refill your medicines before they run out. ? Take other medicines only as told by your doctor or pharmacist.  Stay active if told by your doctor. The elderly and people with severe heart failure should talk with a doctor about physical activity.  Eat heart-healthy foods. Choose foods that are without trans fat and are low in saturated fat, cholesterol, and salt (sodium). This includes fresh or frozen fruits and vegetables, fish, lean meats, fat-free or low-fat dairy foods, whole grains, and high-fiber foods. Lentils and dried peas and beans (legumes) are also good choices.  Limit salt if told by your doctor.  Cook in a healthy way. Roast, grill, broil, bake, poach, steam, or stir-fry foods.  Limit fluids as told by your doctor.  Weigh yourself every morning. Do this after you pee (urinate) and before you eat breakfast. Write down your weight to give to your doctor.  Take your blood pressure and write it down if your doctor tells you to.  Ask your doctor how to check your pulse. Check your pulse as told.  Lose weight if told by your doctor.  Stop smoking or chewing tobacco. Do not use gum or patches that help you quit without your doctor's approval.  Schedule and go to doctor visits as told.  Nonpregnant women should have no more than 1 drink a day. Men should have no more than 2 drinks a day. Talk to your doctor about drinking alcohol.  Stop illegal drug use.  Stay current with shots (immunizations).  Manage your health conditions as told by your doctor.  Learn to manage your stress.  Rest when you are tired.  If it is really hot outside: ? Avoid intense activities. ? Use air conditioning or fans, or get in a cooler place. ? Avoid caffeine and alcohol. ? Wear loose-fitting, lightweight, and light-colored clothing.  If it is really cold outside: ? Avoid intense activities. ? Layer your clothing. ? Wear mittens or gloves, a hat, and a scarf  when going outside. ? Avoid alcohol.  Learn about heart failure and get support as needed.  Get help to maintain or improve your quality of life and your ability to care for yourself as needed. Contact a doctor if:  You gain weight quickly.  You are more short of breath than usual.  You cannot do your normal activities.  You tire easily.  You cough more than normal, especially with activity.  You have any or more puffiness (swelling) in areas such as your hands, feet, ankles, or belly (abdomen).  You cannot sleep because it is hard to breathe.  You feel like your heart is beating fast (palpitations).  You get dizzy or light-headed when you stand up. Get help right away if:  You have trouble breathing.  There is a change in mental status, such as becoming less alert or not being able to focus.  You have chest pain or discomfort.  You faint. This information is not intended  to replace advice given to you by your health care provider. Make sure you discuss any questions you have with your health care provider. Document Released: 05/03/2008 Document Revised: 12/31/2015 Document Reviewed: 09/10/2012 Elsevier Interactive Patient Education  2017 [employer].

## 2017-05-14 NOTE — Care Management Note (Signed)
Case Management Note  Patient Details  Name: KAYCI NEWLON MRN: [id number] Date of Birth: 1965-12-13  Subjective/Objective:    Follow up on repeat CM consult placed for PCP assist (Oct 22 with Abbe Amsterdam at [facility]) and pts request for scale. CM is unaware of any assist with scale but directed her to call [facility] as they often have wellness programs that provide health and wellness equipment such as BP machines and CBG machines and may also provide scale to monitor wt. She is willing to do this and CM placed reminder in AVS.                 Action/Plan:CM will sign off for now but will be available should additional discharge needs arise or disposition change.    Expected Discharge Date:  05/14/17               Expected Discharge Plan:  Home/Self Care  In-House Referral:     Discharge planning Services  CM Consult, Follow-up appt scheduled  Post Acute Care Choice:    Choice offered to:     DME Arranged:    DME Agency:     HH Arranged:    HH Agency:     Status of Service:  In process, will continue to follow  If discussed at Long Length of Stay Meetings, dates discussed:    Additional Comments:  Yvone Neu, RN 05/14/2017, 9:45 AM

## 2017-05-18 ENCOUNTER — Ambulatory Visit (INDEPENDENT_AMBULATORY_CARE_PROVIDER_SITE_OTHER): Payer: 59 | Admitting: Family Medicine

## 2017-05-18 ENCOUNTER — Encounter: Payer: Self-pay | Admitting: Family Medicine

## 2017-05-18 VITALS — BP 146/84 | HR 75 | Temp 98.6°F | Resp 13 | Ht 65.5 in | Wt 215.2 lb

## 2017-05-18 DIAGNOSIS — I5040 Unspecified combined systolic (congestive) and diastolic (congestive) heart failure: Secondary | ICD-10-CM | POA: Diagnosis not present

## 2017-05-18 DIAGNOSIS — I10 Essential (primary) hypertension: Secondary | ICD-10-CM

## 2017-05-18 DIAGNOSIS — Z1211 Encounter for screening for malignant neoplasm of colon: Secondary | ICD-10-CM | POA: Diagnosis not present

## 2017-05-18 DIAGNOSIS — Z7689 Persons encountering health services in other specified circumstances: Secondary | ICD-10-CM | POA: Diagnosis not present

## 2017-05-18 DIAGNOSIS — F17200 Nicotine dependence, unspecified, uncomplicated: Secondary | ICD-10-CM

## 2017-05-18 MED ORDER — BUPROPION HCL ER (SMOKING DET) 150 MG PO TB12: ORAL_TABLET | ORAL | 5 refills | Status: DC

## 2017-05-18 NOTE — Patient Instructions (Addendum)
How to Kick the Smoking Habit   Why should I quit smoking?  Quitting smoking is the most important thing you can do for your health. Smoking can cause cancer, lung disease, heart disease, and many other health problems. Secondhand smoke can be dangerous too. It can cause lung cancer and heart disease in adults. It can make asthma worse or cause ear infections in kids.   You'll see benefits as soon as you quit smoking. Your heart rate and blood pressure will go down. You'll breathe easier. It will be easier to exercise. Your sense of smell and taste will be better. You'll lower your risk of cancer, lung disease, and heart disease. You'll even live longer!   Why is it so hard to quit smoking?  Nicotine is a strong drug. Your body becomes addicted to nicotine when you smoke. You may have withdrawal symptoms or cravings when you stop smoking. You may become anxious or irritable. You might have trouble sleeping or want to eat more. These symptoms are usually worst the first week after quitting. The good news is nicotine withdrawal symptoms only last a few weeks for most people.  The routines and habits that go along with smoking can make it tough to quit too. Some people often smoke a cigarette when they drive, after a meal, or when they're on the phone. Smoking can become a part of these routines. After you quit smoking these habits can be a trigger to make you want to smoke again. It's important to separate smoking from these routines when you quit.  How can I make it easier to quit?  You don't have to quit "cold [place]." You can double or triple the chance that you'll stop smoking if you use a medicine and counseling together. There are many medicines available. These medicines work in different ways to help manage nicotine withdrawal. Many can be bought off the shelves at your local pharmacy. Some require a prescription. Talk to your pharmacist or prescriber about what medicines may be right for  you.  It is very important to have counseling when you quit. Medicines can help you cope with nicotine withdrawal. Counseling can help you develop skills to break smoking habits. There are lots of counseling options available. Many of these are free. Some options are local support groups, telephone quitlines, online services, and texting programs.   Start thinking now about how you plan to quit. Think about why you want to quit. Look at triggers that make you want to smoke. Plan for challenges you might face when trying to quit. Talk to your pharmacist about how to get help.   Where can I learn more?  Toll-free Quitlines and Websites:  In the [place] 1-800-QUIT-NOW(1-(561)483-9891); http://smokefree.gov    These websites include online support, live chat, and text messaging programs.      Coping with Quitting Smoking Quitting smoking is a physical and mental challenge. You will face cravings, withdrawal symptoms, and temptation. Before quitting, work with your health care provider to make a plan that can help you cope. Preparation can help you quit and keep you from giving in. How can I cope with cravings? Cravings usually last for 5-10 minutes. If you get through it, the craving will pass. Consider taking the following actions to help you cope with cravings:  Keep your mouth busy: ? Chew sugar-free gum. ? Suck on hard candies or a straw. ? Brush your teeth.  Keep your hands and body busy: ? Immediately change to a  different activity when you feel a craving. ? Squeeze or play with a ball. ? Do an activity or a hobby, like making bead jewelry, practicing needlepoint, or working with wood. ? Mix up your normal routine. ? Take a short exercise break. Go for a quick walk or run up and down stairs. ? Spend time in public places where smoking is not allowed.  Focus on doing something kind or helpful for someone else.  Call a friend or family member to talk during a craving.  Join a  support group.  Call a quit line, such as 1-800-QUIT-NOW.  Talk with your health care provider about medicines that might help you cope with cravings and make quitting easier for you.  How can I deal with withdrawal symptoms? Your body may experience negative effects as it tries to get used to not having nicotine in the system. These effects are called withdrawal symptoms. They may include:  Feeling hungrier than normal.  Trouble concentrating.  Irritability.  Trouble sleeping.  Feeling depressed.  Restlessness and agitation.  Craving a cigarette.  To manage withdrawal symptoms:  Avoid places, people, and activities that trigger your cravings.  Remember why you want to quit.  Get plenty of sleep.  Avoid coffee and other caffeinated drinks. These may worsen some of your symptoms.  How can I handle social situations? Social situations can be difficult when you are quitting smoking, especially in the first few weeks. To manage this, you can:  Avoid parties, bars, and other social situations where people might be smoking.  Avoid alcohol.  Leave right away if you have the urge to smoke.  Explain to your family and friends that you are quitting smoking. Ask for understanding and support.  Plan activities with friends or family where smoking is not an option.  What are some ways I can cope with stress? Wanting to smoke may cause stress, and stress can make you want to smoke. Find ways to manage your stress. Relaxation techniques can help. For example:  Breathe slowly and deeply, in through your nose and out through your mouth.  Listen to soothing, relaxing music.  Talk with a family member or friend about your stress.  Light a candle.  Soak in a bath or take a shower.  Think about a peaceful place.  What are some ways I can prevent weight gain? Be aware that many people gain weight after they quit smoking. However, not everyone does. To keep from gaining weight,  have a plan in place before you quit and stick to the plan after you quit. Your plan should include:  Having healthy snacks. When you have a craving, it may help to: ? Eat plain popcorn, crunchy carrots, celery, or other cut vegetables. ? Chew sugar-free gum.  Changing how you eat: ? Eat small portion sizes at meals. ? Eat 4-6 small meals throughout the day instead of 1-2 large meals a day. ? Be mindful when you eat. Do not watch television or do other things that might distract you as you eat.  Exercising regularly: ? Make time to exercise each day. If you do not have time for a long workout, do short bouts of exercise for 5-10 minutes several times a day. ? Do some form of strengthening exercise, like weight lifting, and some form of aerobic exercise, like running or swimming.  Drinking plenty of water or other low-calorie or no-calorie drinks. Drink 6-8 glasses of water daily, or as much as instructed by your health  care provider.  Summary  Quitting smoking is a physical and mental challenge. You will face cravings, withdrawal symptoms, and temptation to smoke again. Preparation can help you as you go through these challenges.  You can cope with cravings by keeping your mouth busy (such as by chewing gum), keeping your body and hands busy, and making calls to family, friends, or a helpline for people who want to quit smoking.  You can cope with withdrawal symptoms by avoiding places where people smoke, avoiding drinks with caffeine, and getting plenty of rest.  Ask your health care provider about the different ways to prevent weight gain, avoid stress, and handle social situations. This information is not intended to replace advice given to you by your health care provider. Make sure you discuss any questions you have with your health care provider. Document Released: 07/22/2016 Document Revised: 07/22/2016 Document Reviewed: 07/22/2016 Elsevier Interactive Patient Education  2018  Marine Maintenance, Female Adopting a healthy lifestyle and getting preventive care can go a long way to promote health and wellness. Talk with your health care provider about what schedule of regular examinations is right for you. This is a good chance for you to check in with your provider about disease prevention and staying healthy. In between checkups, there are plenty of things you can do on your own. Experts have done a lot of research about which lifestyle changes and preventive measures are most likely to keep you healthy. Ask your health care provider for more information. Weight and diet Eat a healthy diet  Be sure to include plenty of vegetables, fruits, low-fat dairy products, and lean protein.  Do not eat a lot of foods high in solid fats, added sugars, or salt.  Get regular exercise. This is one of the most important things you can do for your health. ? Most adults should exercise for at least 150 minutes each week. The exercise should increase your heart rate and make you sweat (moderate-intensity exercise). ? Most adults should also do strengthening exercises at least twice a week. This is in addition to the moderate-intensity exercise.  Maintain a healthy weight  Body mass index (BMI) is a measurement that can be used to identify possible weight problems. It estimates body fat based on height and weight. Your health care provider can help determine your BMI and help you achieve or maintain a healthy weight.  For females 22 years of age and older: ? A BMI below 18.5 is considered underweight. ? A BMI of 18.5 to 24.9 is normal. ? A BMI of 25 to 29.9 is considered overweight. ? A BMI of 30 and above is considered obese.  Watch levels of cholesterol and blood lipids  You should start having your blood tested for lipids and cholesterol at 52 years of age, then have this test every 5 years.  You may need to have your cholesterol levels checked more often  if: ? Your lipid or cholesterol levels are high. ? You are older than 52 years of age. ? You are at high risk for heart disease.  Cancer screening Lung Cancer  Lung cancer screening is recommended for adults 72-17 years old who are at high risk for lung cancer because of a history of smoking.  A yearly low-dose CT scan of the lungs is recommended for people who: ? Currently smoke. ? Have quit within the past 15 years. ? Have at least a 30-pack-year history of smoking. A pack year is smoking an average  of one pack of cigarettes a day for 1 year.  Yearly screening should continue until it has been 15 years since you quit.  Yearly screening should stop if you develop a health problem that would prevent you from having lung cancer treatment.  Breast Cancer  Practice breast self-awareness. This means understanding how your breasts normally appear and feel.  It also means doing regular breast self-exams. Let your health care provider know about any changes, no matter how small.  If you are in your 20s or 30s, you should have a clinical breast exam (CBE) by a health care provider every 1-3 years as part of a regular health exam.  If you are 18 or older, have a CBE every year. Also consider having a breast X-ray (mammogram) every year.  If you have a family history of breast cancer, talk to your health care provider about genetic screening.  If you are at high risk for breast cancer, talk to your health care provider about having an MRI and a mammogram every year.  Breast cancer gene (BRCA) assessment is recommended for women who have family members with BRCA-related cancers. BRCA-related cancers include: ? Breast. ? Ovarian. ? Tubal. ? Peritoneal cancers.  Results of the assessment will determine the need for genetic counseling and BRCA1 and BRCA2 testing.  Cervical Cancer Your health care provider may recommend that you be screened regularly for cancer of the pelvic organs  (ovaries, uterus, and vagina). This screening involves a pelvic examination, including checking for microscopic changes to the surface of your cervix (Pap test). You may be encouraged to have this screening done every 3 years, beginning at age 86.  For women ages 37-65, health care providers may recommend pelvic exams and Pap testing every 3 years, or they may recommend the Pap and pelvic exam, combined with testing for human papilloma virus (HPV), every 5 years. Some types of HPV increase your risk of cervical cancer. Testing for HPV may also be done on women of any age with unclear Pap test results.  Other health care providers may not recommend any screening for nonpregnant women who are considered low risk for pelvic cancer and who do not have symptoms. Ask your health care provider if a screening pelvic exam is right for you.  If you have had past treatment for cervical cancer or a condition that could lead to cancer, you need Pap tests and screening for cancer for at least 20 years after your treatment. If Pap tests have been discontinued, your risk factors (such as having a new sexual partner) need to be reassessed to determine if screening should resume. Some women have medical problems that increase the chance of getting cervical cancer. In these cases, your health care provider may recommend more frequent screening and Pap tests.  Colorectal Cancer  This type of cancer can be detected and often prevented.  Routine colorectal cancer screening usually begins at 52 years of age and continues through 52 years of age.  Your health care provider may recommend screening at an earlier age if you have risk factors for colon cancer.  Your health care provider may also recommend using home test kits to check for hidden blood in the stool.  A small camera at the end of a tube can be used to examine your colon directly (sigmoidoscopy or colonoscopy). This is done to check for the earliest forms of  colorectal cancer.  Routine screening usually begins at age 31.  Direct examination of the colon  should be repeated every 5-10 years through 52 years of age. However, you may need to be screened more often if early forms of precancerous polyps or small growths are found.  Skin Cancer  Check your skin from head to toe regularly.  Tell your health care provider about any new moles or changes in moles, especially if there is a change in a mole's shape or color.  Also tell your health care provider if you have a mole that is larger than the size of a pencil eraser.  Always use sunscreen. Apply sunscreen liberally and repeatedly throughout the day.  Protect yourself by wearing long sleeves, pants, a wide-brimmed hat, and sunglasses whenever you are outside.  Heart disease, diabetes, and high blood pressure  High blood pressure causes heart disease and increases the risk of stroke. High blood pressure is more likely to develop in: ? People who have blood pressure in the high end of the normal range (130-139/85-89 mm Hg). ? People who are overweight or obese. ? People who are African American.  If you are 52-7 years of age, have your blood pressure checked every 3-5 years. If you are 64 years of age or older, have your blood pressure checked every year. You should have your blood pressure measured twice-once when you are at a hospital or clinic, and once when you are not at a hospital or clinic. Record the average of the two measurements. To check your blood pressure when you are not at a hospital or clinic, you can use: ? An automated blood pressure machine at a pharmacy. ? A home blood pressure monitor.  If you are between 80 years and 68 years old, ask your health care provider if you should take aspirin to prevent strokes.  Have regular diabetes screenings. This involves taking a blood sample to check your fasting blood sugar level. ? If you are at a normal weight and have a low risk  for diabetes, have this test once every three years after 52 years of age. ? If you are overweight and have a high risk for diabetes, consider being tested at a younger age or more often. Preventing infection Hepatitis B  If you have a higher risk for hepatitis B, you should be screened for this virus. You are considered at high risk for hepatitis B if: ? You were born in a country where hepatitis B is common. Ask your health care provider which countries are considered high risk. ? Your parents were born in a high-risk country, and you have not been immunized against hepatitis B (hepatitis B vaccine). ? You have HIV or AIDS. ? You use needles to inject street drugs. ? You live with someone who has hepatitis B. ? You have had sex with someone who has hepatitis B. ? You get hemodialysis treatment. ? You take certain medicines for conditions, including cancer, organ transplantation, and autoimmune conditions.  Hepatitis C  Blood testing is recommended for: ? Everyone born from [birth year] through 1966. ? Anyone with known risk factors for hepatitis C.  Sexually transmitted infections (STIs)  You should be screened for sexually transmitted infections (STIs) including gonorrhea and chlamydia if: ? You are sexually active and are younger than 52 years of age. ? You are older than 52 years of age and your health care provider tells you that you are at risk for this type of infection. ? Your sexual activity has changed since you were last screened and you are at an increased risk  for chlamydia or gonorrhea. Ask your health care provider if you are at risk.  If you do not have HIV, but are at risk, it may be recommended that you take a prescription medicine daily to prevent HIV infection. This is called pre-exposure prophylaxis (PrEP). You are considered at risk if: ? You are sexually active and do not regularly use condoms or know the HIV status of your partner(s). ? You take drugs by  injection. ? You are sexually active with a partner who has HIV.  Talk with your health care provider about whether you are at high risk of being infected with HIV. If you choose to begin PrEP, you should first be tested for HIV. You should then be tested every 3 months for as long as you are taking PrEP. Pregnancy  If you are premenopausal and you may become pregnant, ask your health care provider about preconception counseling.  If you may become pregnant, take 400 to 800 micrograms (mcg) of folic acid every day.  If you want to prevent pregnancy, talk to your health care provider about birth control (contraception). Osteoporosis and menopause  Osteoporosis is a disease in which the bones lose minerals and strength with aging. This can result in serious bone fractures. Your risk for osteoporosis can be identified using a bone density scan.  If you are 1 years of age or older, or if you are at risk for osteoporosis and fractures, ask your health care provider if you should be screened.  Ask your health care provider whether you should take a calcium or vitamin D supplement to lower your risk for osteoporosis.  Menopause may have certain physical symptoms and risks.  Hormone replacement therapy may reduce some of these symptoms and risks. Talk to your health care provider about whether hormone replacement therapy is right for you. Follow these instructions at home:  Schedule regular health, dental, and eye exams.  Stay current with your immunizations.  Do not use any tobacco products including cigarettes, chewing tobacco, or electronic cigarettes.  If you are pregnant, do not drink alcohol.  If you are breastfeeding, limit how much and how often you drink alcohol.  Limit alcohol intake to no more than 1 drink per day for nonpregnant women. One drink equals 12 ounces of beer, 5 ounces of wine, or 1 ounces of hard liquor.  Do not use street drugs.  Do not share needles.  Ask  your health care provider for help if you need support or information about quitting drugs.  Tell your health care provider if you often feel depressed.  Tell your health care provider if you have ever been abused or do not feel safe at home. This information is not intended to replace advice given to you by your health care provider. Make sure you discuss any questions you have with your health care provider. Document Released: 02/07/2011 Document Revised: 12/31/2015 Document Reviewed: 04/28/2015 Elsevier Interactive Patient Education  2018 [employer]. Bupropion sustained-release tablets (smoking cessation) What is this medicine? BUPROPION (byoo PROE pee on) is used to help people quit smoking. This medicine may be used for other purposes; ask your health care provider or pharmacist if you have questions. COMMON BRAND NAME(S): Buproban, Zyban What should I tell my health care provider before I take this medicine? They need to know if you have any of these conditions: -an eating disorder, such as anorexia or bulimia -bipolar disorder or psychosis -diabetes or high blood sugar, treated with medication -glaucoma -head injury  or brain tumor -heart disease, previous heart attack, or irregular heart beat -high blood pressure -kidney or liver disease -seizures -suicidal thoughts or a previous suicide attempt -Tourette's syndrome -weight loss -an unusual or allergic reaction to bupropion, other medicines, foods, dyes, or preservatives -breast-feeding -pregnant or trying to become pregnant How should I use this medicine? Take this medicine by mouth with a glass of water. Follow the directions on the prescription label. You can take it with or without food. If it upsets your stomach, take it with food. Do not cut, crush or chew this medicine. Take your medicine at regular intervals. If you take this medicine more than once a day, take your second dose at least 8 hours after you take your  first dose. To limit difficulty in sleeping, avoid taking this medicine at bedtime. Do not take your medicine more often than directed. Do not stop taking this medicine suddenly except upon the advice of your doctor. Stopping this medicine too quickly may cause serious side effects. A special MedGuide will be given to you by the pharmacist with each prescription and refill. Be sure to read this information carefully each time. Talk to your pediatrician regarding the use of this medicine in children. Special care may be needed. Overdosage: If you think you have taken too much of this medicine contact a poison control center or emergency room at once. NOTE: This medicine is only for you. Do not share this medicine with others. What if I miss a dose? If you miss a dose, skip the missed dose and take your next tablet at the regular time. There should be at least 8 hours between doses. Do not take double or extra doses. What may interact with this medicine? Do not take this medicine with any of the following medications: -linezolid -MAOIs like Azilect, Carbex, Eldepryl, Marplan, Nardil, and Parnate -methylene blue (injected into a vein) -other medicines that contain bupropion like Wellbutrin This medicine may also interact with the following medications: -alcohol -certain medicines for anxiety or sleep -certain medicines for blood pressure like metoprolol, propranolol -certain medicines for depression or psychotic disturbances -certain medicines for HIV or AIDS like efavirenz, lopinavir, nelfinavir, ritonavir -certain medicines for irregular heart beat like propafenone, flecainide -certain medicines for Parkinson's disease like amantadine, levodopa -certain medicines for seizures like carbamazepine, phenytoin, phenobarbital -cimetidine -clopidogrel -cyclophosphamide -digoxin -furazolidone -isoniazid -nicotine -orphenadrine -procarbazine -steroid medicines like prednisone or  cortisone -stimulant medicines for attention disorders, weight loss, or to stay awake -tamoxifen -theophylline -thiotepa -ticlopidine -tramadol -warfarin This list may not describe all possible interactions. Give your health care provider a list of all the medicines, herbs, non-prescription drugs, or dietary supplements you use. Also tell them if you smoke, drink alcohol, or use illegal drugs. Some items may interact with your medicine. What should I watch for while using this medicine? Visit your doctor or health care professional for regular checks on your progress. This medicine should be used together with a patient support program. It is important to participate in a behavioral program, counseling, or other support program that is recommended by your health care professional. Patients and their families should watch out for new or worsening thoughts of suicide or depression. Also watch out for sudden changes in feelings such as feeling anxious, agitated, panicky, irritable, hostile, aggressive, impulsive, severely restless, overly excited and hyperactive, or not being able to sleep. If this happens, especially at the beginning of treatment or after a change in dose, call your health care professional. Avoid  alcoholic drinks while taking this medicine. Drinking excessive alcoholic beverages, using sleeping or anxiety medicines, or quickly stopping the use of these agents while taking this medicine may increase your risk for a seizure. Do not drive or use heavy machinery until you know how this medicine affects you. This medicine can impair your ability to perform these tasks. Do not take this medicine close to bedtime. It may prevent you from sleeping. Your mouth may get dry. Chewing sugarless gum or sucking hard candy, and drinking plenty of water may help. Contact your doctor if the problem does not go away or is severe. Do not use nicotine patches or chewing gum without the advice of your  doctor or health care professional while taking this medicine. You may need to have your blood pressure taken regularly if your doctor recommends that you use both nicotine and this medicine together. What side effects may I notice from receiving this medicine? Side effects that you should report to your doctor or health care professional as soon as possible: -allergic reactions like skin rash, itching or hives, swelling of the face, lips, or tongue -breathing problems -changes in vision -confusion -elevated mood, decreased need for sleep, racing thoughts, impulsive behavior -fast or irregular heartbeat -hallucinations, loss of contact with reality -increased blood pressure -redness, blistering, peeling or loosening of the skin, including inside the mouth -seizures -suicidal thoughts or other mood changes -unusually weak or tired -vomiting Side effects that usually do not require medical attention (report to your doctor or health care professional if they continue or are bothersome): -constipation -headache -loss of appetite -nausea -tremors -weight loss This list may not describe all possible side effects. Call your doctor for medical advice about side effects. You may report side effects to FDA at 1-800-FDA-1088. Where should I keep my medicine? Keep out of the reach of children. Store at room temperature between 20 and 25 degrees C (68 and 77 degrees F). Protect from light. Keep container tightly closed. Throw away any unused medicine after the expiration date. NOTE: This sheet is a summary. It may not cover all possible information. If you have questions about this medicine, talk to your doctor, pharmacist, or health care provider.  2018 Elsevier/Gold Standard (2016-01-15 13:49:28)

## 2017-05-18 NOTE — Progress Notes (Signed)
Patient presents to clinic today to establish care.  SUBJECTIVE: PMH: Pt is a 51 yo AAF with pmf sig for uncontrolled HTN, tobacco use, and CHF.  Pt was formerly seen by [facility].  Pt recently went to [place] for dyspnea and elevated bp.  Pt was sent from [facility] to the ED where she was found to have a CHF exacerbation.  Pt was admitted from 10/4-10/7/18.  Pt states since d/c she has been compliant with her meds.  She has a scale at home and a bp cuff.  Pt wants to quit smoking.  She was given the patches, but they keep coming off.  She only smoked 2 cigs/day when wearing the patch, but she smoked 4-5 cigs today without having the patch on.  Pt endorses an occasional head pain, that does not last long since d/c.  Pt denies LE edema, SOB, CP, blurred vision, HA, N/V.  Allergies: NKDA  PSurgHx: Tubal ligation   Social: Patient has been married for 9 years. She states her husband have known each other since they were teenagers. Patient has 6 children, 13 grandchildren, and one on the way.  Pt currently smokes 4-5 cigs per day. She has been smoking since age 48.  She states she is ready to quit.  Pt endorses occasional EtOH use.  Pt denies drug use.  FMHx: Mom-desc, Heart dz. Dad-  Sister-EtOH abuse, Cancer, Drug abuse Brother-EtOH abuse, drug abuse  Past Medical History:  Diagnosis Date  . Abnormal EKG   . Dyspnea   . Elevated troponin   . HTN (hypertension)   . Leg edema   . Tobacco abuse     No past surgical history on file.  Current Outpatient Prescriptions on File Prior to Visit  Medication Sig Dispense Refill  . aspirin 81 MG tablet Take 4 tablets (325 mg total) by mouth daily. 30 tablet 0  . ibuprofen (ADVIL,MOTRIN) 200 MG tablet Take 600 mg by mouth every 8 (eight) hours as needed for moderate pain.    Marland Kitchen losartan (COZAAR) 100 MG tablet Take 1 tablet (100 mg total) by mouth daily. 30 tablet 0  . metoprolol succinate (TOPROL-XL) 25 MG 24 hr tablet Take 1 tablet  (25 mg total) by mouth daily. 30 tablet 0  . nicotine (NICODERM CQ - DOSED IN MG/24 HOURS) 21 mg/24hr patch Place 1 patch (21 mg total) onto the skin daily. 28 patch 0  . Pseudoephedrine-APAP-DM (DAYQUIL PO) Take 2 capsules by mouth as needed.     No current facility-administered medications on file prior to visit.     No Known Allergies  Family History  Problem Relation Age of Onset  . CAD Mother   . Diabetes Mother     Social History   Social History  . Marital status: Divorced    Spouse name: N/A  . Number of children: N/A  . Years of education: N/A   Occupational History  . Not on file.   Social History Main Topics  . Smoking status: Current Every Day Smoker    Packs/day: 0.25  . Smokeless tobacco: Never Used  . Alcohol use Yes     Comment: occ beer  . Drug use: No  . Sexual activity: Not on file   Other Topics Concern  . Not on file   Social History Narrative  . No narrative on file    ROS General: Denies fever, chills, night sweats, changes in weight, changes in appetite HEENT: Denies headaches, ear  pain, changes in vision, rhinorrhea, sore throat CV: Denies CP, palpitations, SOB, orthopnea Pulm: Denies SOB, cough, wheezing GI: Denies abdominal pain, nausea, vomiting, diarrhea, constipation GU: Denies dysuria, hematuria, frequency, vaginal discharge Msk: Denies muscle cramps, joint pains Neuro: Denies weakness, numbness, tingling Skin: Denies rashes, bruising Psych: Denies depression, anxiety, hallucinations  BP (!) 146/84 (BP Location: Left Arm, Patient Position: Sitting, Cuff Size: Large)   Pulse 75   Temp 98.6 F (37 C) (Oral)   Resp 13   Ht 5' 5.5" (1.664 m)   Wt 215 lb 3 oz (97.6 kg)   SpO2 96%   BMI 35.26 kg/m   Physical Exam Gen. Pleasant, well developed, well-nourished, in NAD HEENT - [place]/AT, PERRL, no scleral icterus, no nasal drainage, pharynx without erythema or exudate. Neck: No JVD, no thyromegaly, no carotid bruits Lungs: no use  of accessory muscles, no dullness to percussion, CTAB, no wheezes, rales or rhonchi Cardiovascular: RRR, 1/6 heart murmur best heard R upper sternal boarder, no peripheral edema Abdomen: BS present, soft, nontender,nondistended, no hepatosplenomegaly Musculoskeletal: No deformities, moves all four extremities, no cyanosis or clubbing, normal tone. Neuro:  A&Ox3, CN II-XII intact, normal gait Skin:  Warm, dry, intact, no lesions Psych: normal affect, mood appropriate  Recent Results (from the past 2160 hour(s))  CBC     Status: Abnormal   Collection Time: 05/11/17 11:28 AM  Result Value Ref Range   WBC 6.2 3.4 - 10.8 x10E3/uL   RBC 4.32 3.77 - 5.28 x10E6/uL   Hemoglobin 12.0 11.1 - 15.9 g/dL   Hematocrit 38.5 34.0 - 46.6 %   MCV 89 79 - 97 fL   MCH 27.8 26.6 - 33.0 pg   MCHC 31.2 (L) 31.5 - 35.7 g/dL   RDW 14.5 12.3 - 15.4 %   Platelets 243 150 - 379 x10E3/uL  CMP14+EGFR     Status: None   Collection Time: 05/11/17 11:28 AM  Result Value Ref Range   Glucose 94 65 - 99 mg/dL   BUN 8 6 - 24 mg/dL   Creatinine, Ser 0.74 0.57 - 1.00 mg/dL   GFR calc non Af Amer 93 >59 mL/min/1.73   GFR calc Af Amer 108 >59 mL/min/1.73   BUN/Creatinine Ratio 11 9 - 23   Sodium 141 134 - 144 mmol/L   Potassium 4.5 3.5 - 5.2 mmol/L   Chloride 101 96 - 106 mmol/L   CO2 23 20 - 29 mmol/L   Calcium 9.3 8.7 - 10.2 mg/dL   Total Protein 7.1 6.0 - 8.5 g/dL   Albumin 4.5 3.5 - 5.5 g/dL   Globulin, Total 2.6 1.5 - 4.5 g/dL   Albumin/Globulin Ratio 1.7 1.2 - 2.2   Bilirubin Total 0.4 0.0 - 1.2 mg/dL   Alkaline Phosphatase 116 39 - 117 IU/L   AST 16 0 - 40 IU/L   ALT 17 0 - 32 IU/L  CBC with Differential/Platelet     Status: Abnormal   Collection Time: 05/11/17  1:23 PM  Result Value Ref Range   WBC 4.9 4.0 - 10.5 K/uL   RBC 4.00 3.87 - 5.11 MIL/uL   Hemoglobin 11.0 (L) 12.0 - 15.0 g/dL   HCT 35.6 (L) 36.0 - 46.0 %   MCV 89.0 78.0 - 100.0 fL   MCH 27.5 26.0 - 34.0 pg   MCHC 30.9 30.0 - 36.0 g/dL    RDW 14.5 11.5 - 15.5 %   Platelets 196 150 - 400 K/uL   Neutrophils Relative % 50 %  Neutro Abs 2.4 1.7 - 7.7 K/uL   Lymphocytes Relative 40 %   Lymphs Abs 2.0 0.7 - 4.0 K/uL   Monocytes Relative 8 %   Monocytes Absolute 0.4 0.1 - 1.0 K/uL   Eosinophils Relative 2 %   Eosinophils Absolute 0.1 0.0 - 0.7 K/uL   Basophils Relative 0 %   Basophils Absolute 0.0 0.0 - 0.1 K/uL  Brain natriuretic peptide     Status: Abnormal   Collection Time: 05/11/17  1:23 PM  Result Value Ref Range   B Natriuretic Peptide 328.7 (H) 0.0 - 100.0 pg/mL  Troponin I     Status: Abnormal   Collection Time: 05/11/17  1:23 PM  Result Value Ref Range   Troponin I 0.03 (HH) <0.03 ng/mL    Comment: CRITICAL RESULT CALLED TO, READ BACK BY AND VERIFIED WITH: B.MILLER,RN 05/11/17 1430 BY BSLADE   Basic metabolic panel     Status: None   Collection Time: 05/11/17  1:23 PM  Result Value Ref Range   Sodium 137 135 - 145 mmol/L   Potassium 4.1 3.5 - 5.1 mmol/L   Chloride 106 101 - 111 mmol/L   CO2 24 22 - 32 mmol/L   Glucose, Bld 91 65 - 99 mg/dL   BUN 7 6 - 20 mg/dL   Creatinine, Ser 0.70 0.44 - 1.00 mg/dL   Calcium 8.9 8.9 - 10.3 mg/dL   GFR calc non Af Amer >60 >60 mL/min   GFR calc Af Amer >60 >60 mL/min    Comment: (NOTE) The eGFR has been calculated using the CKD EPI equation. This calculation has not been validated in all clinical situations. eGFR's persistently <60 mL/min signify possible Chronic Kidney Disease.    Anion gap 7 5 - 15  Troponin I     Status: Abnormal   Collection Time: 05/11/17  5:52 PM  Result Value Ref Range   Troponin I 0.03 (HH) <0.03 ng/mL    Comment: CRITICAL VALUE NOTED.  VALUE IS CONSISTENT WITH PREVIOUSLY REPORTED AND CALLED VALUE.  HIV antibody (Routine Testing)     Status: None   Collection Time: 05/11/17  5:52 PM  Result Value Ref Range   HIV Screen 4th Generation wRfx Non Reactive Non Reactive    Comment: (NOTE) Performed At: [facility] [place], [place] [id number] Lindon Romp MD YT:[id number]   Lipid panel     Status: None   Collection Time: 05/11/17  5:52 PM  Result Value Ref Range   Cholesterol 140 0 - 200 mg/dL   Triglycerides 120 <150 mg/dL   HDL 56 >40 mg/dL   Total CHOL/HDL Ratio 2.5 RATIO   VLDL 24 0 - 40 mg/dL   LDL Cholesterol 60 0 - 99 mg/dL    Comment:        Total Cholesterol/HDL:CHD Risk Coronary Heart Disease Risk Table                     Men   Women  1/2 Average Risk   3.4   3.3  Average Risk       5.0   4.4  2 X Average Risk   9.6   7.1  3 X Average Risk  23.4   11.0        Use the calculated Patient Ratio above and the CHD Risk Table to determine the patient's CHD Risk.        ATP III CLASSIFICATION (LDL):  <100  mg/dL   Optimal  293-970  mg/dL   Near or Above                    Optimal  130-159  mg/dL   Borderline  125-890  mg/dL   High  >912     mg/dL   Very High   Troponin I     Status: Abnormal   Collection Time: 05/11/17 10:11 PM  Result Value Ref Range   Troponin I 0.03 (HH) <0.03 ng/mL    Comment: CRITICAL VALUE NOTED.  VALUE IS CONSISTENT WITH PREVIOUSLY REPORTED AND CALLED VALUE.  Urinalysis, Routine w reflex microscopic     Status: Abnormal   Collection Time: 05/11/17 11:40 PM  Result Value Ref Range   Color, Urine STRAW (A) YELLOW   APPearance CLEAR CLEAR   Specific Gravity, Urine 1.006 1.005 - 1.030   pH 6.0 5.0 - 8.0   Glucose, UA NEGATIVE NEGATIVE mg/dL   Hgb urine dipstick NEGATIVE NEGATIVE   Bilirubin Urine NEGATIVE NEGATIVE   Ketones, ur NEGATIVE NEGATIVE mg/dL   Protein, ur NEGATIVE NEGATIVE mg/dL   Nitrite NEGATIVE NEGATIVE   Leukocytes, UA NEGATIVE NEGATIVE  Troponin I     Status: Abnormal   Collection Time: 05/12/17  4:32 AM  Result Value Ref Range   Troponin I 0.03 (HH) <0.03 ng/mL    Comment: CRITICAL VALUE NOTED.  VALUE IS CONSISTENT WITH PREVIOUSLY REPORTED AND CALLED VALUE.  Basic metabolic panel     Status: Abnormal   Collection Time:  05/12/17  4:32 AM  Result Value Ref Range   Sodium 139 135 - 145 mmol/L   Potassium 3.7 3.5 - 5.1 mmol/L   Chloride 101 101 - 111 mmol/L   CO2 28 22 - 32 mmol/L   Glucose, Bld 104 (H) 65 - 99 mg/dL   BUN 10 6 - 20 mg/dL   Creatinine, Ser 7.10 0.44 - 1.00 mg/dL   Calcium 8.9 8.9 - 41.4 mg/dL   GFR calc non Af Amer >60 >60 mL/min   GFR calc Af Amer >60 >60 mL/min    Comment: (NOTE) The eGFR has been calculated using the CKD EPI equation. This calculation has not been validated in all clinical situations. eGFR's persistently <60 mL/min signify possible Chronic Kidney Disease.    Anion gap 10 5 - 15  CBC     Status: None   Collection Time: 05/12/17  4:32 AM  Result Value Ref Range   WBC 4.8 4.0 - 10.5 K/uL   RBC 4.29 3.87 - 5.11 MIL/uL   Hemoglobin 12.4 12.0 - 15.0 g/dL   HCT 12.0 55.8 - 79.7 %   MCV 89.7 78.0 - 100.0 fL   MCH 28.9 26.0 - 34.0 pg   MCHC 32.2 30.0 - 36.0 g/dL   RDW 84.9 14.7 - 60.7 %   Platelets 228 150 - 400 K/uL  ECHOCARDIOGRAM COMPLETE     Status: None   Collection Time: 05/12/17  1:05 PM  Result Value Ref Range   Weight 3,328 oz   Height 66 in   BP 169/96 mmHg  Basic metabolic panel     Status: Abnormal   Collection Time: 05/13/17  2:32 AM  Result Value Ref Range   Sodium 139 135 - 145 mmol/L   Potassium 4.0 3.5 - 5.1 mmol/L   Chloride 101 101 - 111 mmol/L   CO2 29 22 - 32 mmol/L   Glucose, Bld 118 (H) 65 - 99 mg/dL   BUN  18 6 - 20 mg/dL   Creatinine, Ser 0.70 0.44 - 1.00 mg/dL   Calcium 9.1 8.9 - 10.3 mg/dL   GFR calc non Af Amer >60 >60 mL/min   GFR calc Af Amer >60 >60 mL/min    Comment: (NOTE) The eGFR has been calculated using the CKD EPI equation. This calculation has not been validated in all clinical situations. eGFR's persistently <60 mL/min signify possible Chronic Kidney Disease.    Anion gap 9 5 - 15  Basic metabolic panel     Status: Abnormal   Collection Time: 05/14/17  5:02 AM  Result Value Ref Range   Sodium 139 135 - 145  mmol/L   Potassium 4.2 3.5 - 5.1 mmol/L   Chloride 100 (L) 101 - 111 mmol/L   CO2 30 22 - 32 mmol/L   Glucose, Bld 112 (H) 65 - 99 mg/dL   BUN 20 6 - 20 mg/dL   Creatinine, Ser 0.65 0.44 - 1.00 mg/dL   Calcium 9.2 8.9 - 10.3 mg/dL   GFR calc non Af Amer >60 >60 mL/min   GFR calc Af Amer >60 >60 mL/min    Comment: (NOTE) The eGFR has been calculated using the CKD EPI equation. This calculation has not been validated in all clinical situations. eGFR's persistently <60 mL/min signify possible Chronic Kidney Disease.    Anion gap 9 5 - 15    Assessment/Plan: Combined systolic and diastolic congestive heart failure, unspecified HF chronicity (HCC) -Advised to contact Cardiology for f/u appt -Advised to continue avoiding excess salt.  Encouraged to continue eating better, weighing herself on the same scale daily, and checking bp. -given handouts -Continue losartan100 mg and metoprolol 25 mg   Screen for colon cancer  - Plan: [facility] COLONOSCOPY  Tobacco use disorder -d/c patches as they do not stay on. -given handout on medication -encouraged to set a quit date for next wk. -given 1-800 Quit now info - Plan: buPROPion (ZYBAN) 150 MG 12 hr tablet -f/u in 1 mo  Essential hypertension -continue checking bp at home -continue current meds losartan 100 mg and metoprolol 25 mg -will adjust prn.  Discussed how quitting smoking will help bp.  Encounter to establish care -records release   F/u in 91mo

## 2017-05-22 NOTE — Progress Notes (Deleted)
CARDIOLOGY OFFICE NOTE  Date:  05/22/2017    Dallie Piles Date of Birth: 07-Jun-1966 Medical Record #[id number]  PCP:  [name], MD  Cardiologist:  Eden Emms  No chief complaint on file.   History of Present Illness: Teresa [name] is a 51 y.o. female who presents today for a post hospital visit. Seen for Dr. Eden Emms.   Shehas a history of long standing HTN, noncompliance, and tobacco abuse.   Presented earlier this month to the ER from PCP office for shortness of breath, swelling and abnormal EKG. Had not been taking her BP medicines in over 2 years. Medicines restarted. Echo obtained - EF down to 25 to 30%. Seen by Dr. Eden Emms who favors repeat echo in 3 months with up titration of medicines in the interim. No indication at this time for cardiac cath.   Comes in today. Here with   Past Medical History:  Diagnosis Date  . Abnormal EKG   . Dyspnea   . Elevated troponin   . HTN (hypertension)   . Leg edema   . Tobacco abuse     No past surgical history on file.   Medications: No outpatient prescriptions have been marked as taking for the 05/23/17 encounter (Appointment) with Rosalio Macadamia, NP.     Allergies: No Known Allergies  Social History: The patient  reports that she has been smoking.  She has been smoking about 0.25 packs per day. She has never used smokeless tobacco. She reports that she drinks alcohol. She reports that she does not use drugs.   Family History: The patient's family history includes CAD in her mother; Diabetes in her mother.   Review of Systems: Please see the history of present illness.   Otherwise, the review of systems is positive for none.   All other systems are reviewed and negative.   Physical Exam: VS:  There were no vitals taken for this visit. Marland Kitchen  BMI There is no height or weight on file to calculate BMI.  Wt Readings from Last 3 Encounters:  05/18/17 215 lb 3 oz (97.6 kg)  05/14/17 209 lb 1.6 oz (94.8 kg)    05/11/17 216 lb (98 kg)    General: Pleasant. Well developed, well nourished and in no acute distress.   HEENT: Normal.  Neck: Supple, no JVD, carotid bruits, or masses noted.  Cardiac: Regular rate and rhythm. No murmurs, rubs, or gallops. No edema.  Respiratory:  Lungs are clear to auscultation bilaterally with normal work of breathing.  GI: Soft and nontender.  MS: No deformity or atrophy. Gait and ROM intact.  Skin: Warm and dry. Color is normal.  Neuro:  Strength and sensation are intact and no gross focal deficits noted.  Psych: Alert, appropriate and with normal affect.   LABORATORY DATA:  EKG:  EKG is not ordered today.  Lab Results  Component Value Date   WBC 4.8 05/12/2017   HGB 12.4 05/12/2017   HCT 38.5 05/12/2017   PLT 228 05/12/2017   GLUCOSE 112 (H) 05/14/2017   CHOL 140 05/11/2017   TRIG 120 05/11/2017   HDL 56 05/11/2017   LDLCALC 60 05/11/2017   ALT 17 05/11/2017   AST 16 05/11/2017   NA 139 05/14/2017   K 4.2 05/14/2017   CL 100 (L) 05/14/2017   CREATININE 0.65 05/14/2017   BUN 20 05/14/2017   CO2 30 05/14/2017     BNP (last 3 results)  Recent Labs  05/11/17 1323  BNP 328.7*    ProBNP (last 3 results) No results for input(s): PROBNP in the last 8760 hours.   Other Studies Reviewed Today:  Echo Study Conclusions 05/2017  - Left ventricle: The cavity size was moderately dilated. There was   moderate concentric hypertrophy. Systolic function was severely   reduced. The estimated ejection fraction was in the range of 25%   to 30%. Severe diffuse hypokinesis with no identifiable regional   variations. Features are consistent with a pseudonormal left   ventricular filling pattern, with concomitant abnormal relaxation   and increased filling pressure (grade 2 diastolic dysfunction).   Doppler parameters are consistent with high ventricular filling   pressure. - Aortic valve: Valve area (VTI): 2.57 cm^2. Valve area (Vmax): 2.7   cm^2.  Valve area (Vmean): 2.49 cm^2. - Mitral valve: There was mild regurgitation. - Left atrium: The atrium was mildly dilated. - Pulmonic valve: There was trivial regurgitation. - Pericardium, extracardiac: A trivial, free-flowing pericardial   effusion was identified along the right atrial free wall. The   fluid had no internal echoes. There was mildright atrial chamber   collapse for less than 50% of the cardiac cycle.   Assessment/Plan: 1. HTN - with noted non compliance - medicines restarted.   2. Combined systolic and diastolic HF - EF of 25 to 30% - plan to repeat 3 months after being back on medicines. Hold on cardiac cath for now per Dr. Eden Emms.    3. Dyspnea - with tobacco abuse and now with newly reduced EF of 25 to 30% -   4. Tobacco smoking   Current medicines are reviewed with the patient today.  The patient does not have concerns regarding medicines other than what has been noted above.  The following changes have been made:  See above.  Labs/ tests ordered today include:   No orders of the defined types were placed in this encounter.    Disposition:   FU with *** in {gen number [id number]} {Days to years:10300}.   Patient is agreeable to this plan and will call if any problems develop in the interim.   SignedNorma Fredrickson, NP  05/22/2017 4:18 PM  [facility] [address] [place], [place]  [address] Phone: [phone] Fax: [phone]

## 2017-05-23 ENCOUNTER — Ambulatory Visit: Payer: 59 | Admitting: Nurse Practitioner

## 2017-05-24 ENCOUNTER — Encounter: Payer: Self-pay | Admitting: Nurse Practitioner

## 2017-06-12 NOTE — Progress Notes (Signed)
Cardiology Office Note   Date:  06/15/2017   ID:  Teresa [name], DOB 1965-10-29, MRN [id number]  PCP:  [name], MD  Cardiologist:   Charlton Haws, MD   No chief complaint on file.     History of Present Illness: Teresa [name] is a 51 y.o. female who presents for f/u non ischemic DCM Seen in hospital as new consult October 2018 History of HTN non compliant with meds, smoking Echo with EF 25-30% diffuse hypokinesis mild MR Trying to quit smoking on zyban Still having one during her breaks at [facility]. Weight is down Compliant with meds BP still High. Her husband [name] is patient of Jens Som has stents. They are [employer] fans     Past Medical History:  Diagnosis Date  . Abnormal EKG   . Dyspnea   . Elevated troponin   . HTN (hypertension)   . Leg edema   . Tobacco abuse     History reviewed. No pertinent surgical history.   Current Outpatient Medications  Medication Sig Dispense Refill  . aspirin 81 MG tablet Take 4 tablets (325 mg total) by mouth daily. 30 tablet 0  . buPROPion (ZYBAN) 150 MG 12 hr tablet Take 1 tablet daily for 3 days.  On day 4 take 1 tablet twice a day 60 tablet 5  . ibuprofen (ADVIL,MOTRIN) 200 MG tablet Take 600 mg by mouth every 8 (eight) hours as needed for moderate pain.    Marland Kitchen losartan (COZAAR) 100 MG tablet Take 1 tablet (100 mg total) by mouth daily. 30 tablet 0  . metoprolol succinate (TOPROL-XL) 25 MG 24 hr tablet Take 1 tablet (25 mg total) by mouth daily. 30 tablet 0  . nicotine (NICODERM CQ - DOSED IN MG/24 HOURS) 21 mg/24hr patch Place 1 patch (21 mg total) onto the skin daily. 28 patch 0  . Pseudoephedrine-APAP-DM (DAYQUIL PO) Take 2 capsules by mouth as needed.     No current facility-administered medications for this visit.     Allergies:   Patient has no known allergies.    Social History:  The patient  reports that she has been smoking.  She has been smoking about 0.25 packs per day. she has never used smokeless  tobacco. She reports that she drinks alcohol. She reports that she does not use drugs.   Family History:  The patient's family history includes CAD in her mother; Diabetes in her mother.    ROS:  Please see the history of present illness.   Otherwise, review of systems are positive for none.   All other systems are reviewed and negative.    PHYSICAL EXAM: VS:  Ht 5' 5.5" (1.664 m)   BMI 35.26 kg/m  , BMI Body mass index is 35.26 kg/m. Affect appropriate Healthy:  appears stated age HEENT: normal Neck supple with no adenopathy JVP normal no bruits no thyromegaly Lungs clear with no wheezing and good diaphragmatic motion Heart:  S1/S2 no murmur, no rub, gallop or click PMI normal Abdomen: benighn, BS positve, no tenderness, no AAA no bruit.  No HSM or HJR Distal pulses intact with no bruits No edema Neuro non-focal Skin warm and dry No muscular weakness    EKG:  SR nonspecific ST/T wave changes 05/12/17   Recent Labs: 05/11/2017: ALT 17; B Natriuretic Peptide 328.7 05/12/2017: Hemoglobin 12.4; Platelets 228 05/14/2017: BUN 20; Creatinine, Ser 0.65; Potassium 4.2; Sodium 139    Lipid Panel    Component Value Date/Time   CHOL  140 05/11/2017 1752   TRIG 120 05/11/2017 1752   HDL 56 05/11/2017 1752   CHOLHDL 2.5 05/11/2017 1752   VLDL 24 05/11/2017 1752   LDLCALC 60 05/11/2017 1752      Wt Readings from Last 3 Encounters:  05/18/17 215 lb 3 oz (97.6 kg)  05/14/17 209 lb 1.6 oz (94.8 kg)  05/11/17 216 lb (98 kg)      Other studies Reviewed: Additional studies/ records that were reviewed today include[facility] admission[facility] labs ECG, CXR and echo .  Echo 05/12/17 Study Conclusions  - Left ventricle: The cavity size was moderately dilated. There was   moderate concentric hypertrophy. Systolic function was severely   reduced. The estimated ejection fraction was in the range of 25%   to 30%. Severe diffuse hypokinesis with no  identifiable regional   variations. Features are consistent with a pseudonormal left   ventricular filling pattern, with concomitant abnormal relaxation   and increased filling pressure (grade 2 diastolic dysfunction).   Doppler parameters are consistent with high ventricular filling   pressure. - Aortic valve: Valve area (VTI): 2.57 cm^2. Valve area (Vmax): 2.7   cm^2. Valve area (Vmean): 2.49 cm^2. - Mitral valve: There was mild regurgitation. - Left atrium: The atrium was mildly dilated. - Pulmonic valve: There was trivial regurgitation. - Pericardium, extracardiac: A trivial, free-flowing pericardial   effusion was identified along the right atrial free wall. The   fluid had no internal echoes. There was mildright atrial chamber   collapse for less than 50% of the cardiac cycle.   ASSESSMENT AND PLAN:  1.  CHF: better add HCTZ needs f/u MRI/Echo once BP controlled 2. HTN add hydralazine continue diet and weight loss goals 3. Smoking on zyban cutting back doing better    Current medicines are reviewed at length with the patient today.  The patient does not have concerns regarding medicines.  The following changes have been made:  Hydralazine 50 tid HCTZ 12.5 mg daily   Labs/ tests ordered today include: none  No orders of the defined types were placed in this encounter.    Disposition:   FU with me next available      Signed, Charlton HawsPeter Orlan Aversa, MD  06/15/2017 8:04 AM    [facility] [address], [place], [place]  [address] Phone: [phone]; Fax: [phone]

## 2017-06-15 ENCOUNTER — Encounter: Payer: Self-pay | Admitting: Cardiovascular Disease

## 2017-06-15 ENCOUNTER — Ambulatory Visit: Payer: 59 | Admitting: Cardiovascular Disease

## 2017-06-15 ENCOUNTER — Encounter (INDEPENDENT_AMBULATORY_CARE_PROVIDER_SITE_OTHER): Payer: Self-pay

## 2017-06-15 VITALS — BP 168/82 | HR 80 | Ht 65.5 in | Wt 213.0 lb

## 2017-06-15 DIAGNOSIS — I5043 Acute on chronic combined systolic (congestive) and diastolic (congestive) heart failure: Secondary | ICD-10-CM | POA: Diagnosis not present

## 2017-06-15 MED ORDER — HYDRALAZINE HCL 50 MG PO TABS: 50.0000 mg | ORAL_TABLET | Freq: Three times a day (TID) | ORAL | 3 refills | Status: DC

## 2017-06-15 MED ORDER — LOSARTAN POTASSIUM-HCTZ 100-12.5 MG PO TABS: 1.0000 | ORAL_TABLET | Freq: Every day | ORAL | 3 refills | Status: DC

## 2017-06-15 MED ORDER — METOPROLOL SUCCINATE ER 25 MG PO TB24: 25.0000 mg | ORAL_TABLET | Freq: Every day | ORAL | 3 refills | Status: DC

## 2017-06-15 NOTE — Patient Instructions (Addendum)
Medication Instructions:  Your physician has recommended you make the following change in your medication:  1. STOP Losartan 100mg            2. START Hyzaar 100/12.5mg  1 tablet by mouth daily            3. START Hydralazine 50 mg by mouth 1 tablet three daily  Labwork: NONE  Testing/Procedures: NONE  Follow-Up: Your physician wants you to follow-up in: next available with Dr. Eden Emms.    If you need a refill on your cardiac medications before your next appointment, please call your pharmacy.

## 2017-06-22 ENCOUNTER — Other Ambulatory Visit (HOSPITAL_COMMUNITY): Payer: 59

## 2017-06-22 ENCOUNTER — Emergency Department (HOSPITAL_COMMUNITY)
Admission: EM | Admit: 2017-06-22 | Discharge: 2017-06-22 | Disposition: A | Discharge status: Home / Self Care | Payer: 59 | Attending: Emergency Medicine | Admitting: Emergency Medicine

## 2017-06-22 ENCOUNTER — Emergency Department (HOSPITAL_COMMUNITY): Payer: 59

## 2017-06-22 ENCOUNTER — Encounter (HOSPITAL_COMMUNITY): Payer: Self-pay | Admitting: *Deleted

## 2017-06-22 DIAGNOSIS — F17200 Nicotine dependence, unspecified, uncomplicated: Secondary | ICD-10-CM | POA: Insufficient documentation

## 2017-06-22 DIAGNOSIS — Z7982 Long term (current) use of aspirin: Secondary | ICD-10-CM | POA: Diagnosis not present

## 2017-06-22 DIAGNOSIS — Z79899 Other long term (current) drug therapy: Secondary | ICD-10-CM | POA: Diagnosis not present

## 2017-06-22 DIAGNOSIS — R1110 Vomiting, unspecified: Secondary | ICD-10-CM | POA: Insufficient documentation

## 2017-06-22 DIAGNOSIS — I10 Essential (primary) hypertension: Secondary | ICD-10-CM | POA: Diagnosis not present

## 2017-06-22 DIAGNOSIS — R1013 Epigastric pain: Secondary | ICD-10-CM | POA: Diagnosis present

## 2017-06-22 DIAGNOSIS — K2970 Gastritis, unspecified, without bleeding: Secondary | ICD-10-CM

## 2017-06-22 DIAGNOSIS — R109 Unspecified abdominal pain: Secondary | ICD-10-CM | POA: Diagnosis not present

## 2017-06-22 LAB — URINALYSIS, ROUTINE W REFLEX MICROSCOPIC
BACTERIA UA: NONE SEEN
Bilirubin Urine: NEGATIVE
Glucose, UA: NEGATIVE mg/dL
HGB URINE DIPSTICK: NEGATIVE
Ketones, ur: NEGATIVE mg/dL
LEUKOCYTES UA: NEGATIVE
Nitrite: NEGATIVE
PROTEIN: 30 mg/dL — AB
SPECIFIC GRAVITY, URINE: 1.017 (ref 1.005–1.030)
pH: 6 (ref 5.0–8.0)

## 2017-06-22 LAB — CBC
HCT: 40.3 % (ref 36.0–46.0)
Hemoglobin: 12.5 g/dL (ref 12.0–15.0)
MCH: 28 pg (ref 26.0–34.0)
MCHC: 31 g/dL (ref 30.0–36.0)
MCV: 90.2 fL (ref 78.0–100.0)
PLATELETS: 234 10*3/uL (ref 150–400)
RBC: 4.47 MIL/uL (ref 3.87–5.11)
RDW: 14.4 % (ref 11.5–15.5)
WBC: 4.9 10*3/uL (ref 4.0–10.5)

## 2017-06-22 LAB — COMPREHENSIVE METABOLIC PANEL
ALBUMIN: 3.9 g/dL (ref 3.5–5.0)
ALT: 19 U/L (ref 14–54)
AST: 21 U/L (ref 15–41)
Alkaline Phosphatase: 110 U/L (ref 38–126)
Anion gap: 6 (ref 5–15)
BILIRUBIN TOTAL: 0.6 mg/dL (ref 0.3–1.2)
BUN: 20 mg/dL (ref 6–20)
CHLORIDE: 100 mmol/L — AB (ref 101–111)
CO2: 30 mmol/L (ref 22–32)
CREATININE: 1.02 mg/dL — AB (ref 0.44–1.00)
Calcium: 9.2 mg/dL (ref 8.9–10.3)
GFR calc Af Amer: >60 mL/min (ref >60)
GLUCOSE: 137 mg/dL — AB (ref 65–99)
Potassium: 4 mmol/L (ref 3.5–5.1)
SODIUM: 136 mmol/L (ref 135–145)
Total Protein: 7.2 g/dL (ref 6.5–8.1)

## 2017-06-22 LAB — LIPASE, BLOOD: Lipase: 35 U/L (ref 11–51)

## 2017-06-22 MED ORDER — OMEPRAZOLE 20 MG PO CPDR: 20.0000 mg | DELAYED_RELEASE_CAPSULE | Freq: Every day | ORAL | 1 refills | Status: DC

## 2017-06-22 MED ORDER — GI COCKTAIL ~~LOC~~
30.0000 mL | Freq: Once | ORAL | Status: AC
  Administered 2017-06-22: 30 mL via ORAL
  Filled 2017-06-22: qty 30

## 2017-06-22 MED ORDER — ONDANSETRON 4 MG PO TBDP
4.0000 mg | ORAL_TABLET | Freq: Once | ORAL | Status: AC
  Administered 2017-06-22: 4 mg via ORAL
  Filled 2017-06-22: qty 1

## 2017-06-22 NOTE — ED Notes (Signed)
Pt states "I think food with stuff like spaghetti sauce, acidic stuff makes it worse. Sipping some sprite makes it better".

## 2017-06-22 NOTE — Discharge Instructions (Signed)
Return if any problems.  See your Physician for recheck.  Return if any problems.  

## 2017-06-22 NOTE — ED Notes (Signed)
Patient transported to Ultrasound 

## 2017-06-22 NOTE — ED Provider Notes (Signed)
[facility] Provider Note   CSN: [id number] Arrival date & time: 06/22/17  1341     History   Chief Complaint Chief Complaint  Patient presents with  . Abdominal Pain    HPI Teresa [name] is a 51 y.o. female.  The history is provided by the patient. No language interpreter was used.  Abdominal Pain   This is a new problem. The problem occurs constantly. The problem has been gradually worsening. The pain is associated with eating. The pain is located in the epigastric region. The pain is severe. Pertinent negatives include anorexia and vomiting. Nothing aggravates the symptoms. Nothing relieves the symptoms. Past workup does not include GI consult. Her past medical history is significant for PUD.  Pt complains of pain in the epigastric area.  Pain is worse after food.  No fever, no chills   Past Medical History:  Diagnosis Date  . Abnormal EKG   . Dyspnea   . Elevated troponin   . HTN (hypertension)   . Leg edema   . Tobacco abuse     Patient Active Problem List   Diagnosis Date Noted  . Hypertension 05/11/2017  . Orthopnea 05/11/2017  . Leg edema 05/11/2017  . Anemia 05/11/2017  . Elevated troponin   . Dyspnea   . Abnormal EKG   . Tobacco abuse     History reviewed. No pertinent surgical history.  OB History    No data available       Home Medications    Prior to Admission medications   Medication Sig Start Date End Date Taking? Authorizing Provider  aspirin 81 MG tablet Take 4 tablets (325 mg total) by mouth daily. 05/14/17   Mikhail, Nita SellsMaryann, DO  buPROPion (ZYBAN) 150 MG 12 hr tablet Take 1 tablet daily for 3 days.  On day 4 take 1 tablet twice a day 05/18/17   Deeann SaintBanks, Shannon R, MD  hydrALAZINE (APRESOLINE) 50 MG tablet Take 1 tablet (50 mg total) 3 (three) times daily by mouth. 06/15/17 09/13/17  Wendall StadeNishan, Peter C, MD  ibuprofen (ADVIL,MOTRIN) 200 MG tablet Take 600 mg by mouth every 8 (eight) hours as needed for  moderate pain.    [provider]  losartan-hydrochlorothiazide (HYZAAR) 100-12.5 MG tablet Take 1 tablet daily by mouth. 06/15/17   Wendall StadeNishan, Peter C, MD  metoprolol succinate (TOPROL-XL) 25 MG 24 hr tablet Take 1 tablet (25 mg total) daily by mouth. 06/15/17   Wendall StadeNishan, Peter C, MD  Pseudoephedrine-APAP-DM (DAYQUIL PO) Take 2 capsules by mouth as needed.    [provider]    Family History Family History  Problem Relation Age of Onset  . CAD Mother   . Diabetes Mother     Social History Social History   Tobacco Use  . Smoking status: Current Every Day Smoker    Packs/day: 0.25  . Smokeless tobacco: Never Used  Substance Use Topics  . Alcohol use: Yes    Comment: occ beer  . Drug use: No     Allergies   Patient has no known allergies.   Review of Systems Review of Systems  Gastrointestinal: Positive for abdominal pain. Negative for anorexia and vomiting.  All other systems reviewed and are negative.    Physical Exam Updated Vital Signs BP 118/70 (BP Location: Right Arm)   Pulse 83   Temp 98.5 F (36.9 C) (Oral)   Resp 16   SpO2 100%   Physical Exam  Constitutional: She is oriented to  person, place, and time. She appears well-developed and well-nourished.  HENT:  Head: Normocephalic.  Mouth/Throat: Oropharynx is clear and moist.  Eyes: EOM are normal. Pupils are equal, round, and reactive to light.  Neck: Normal range of motion.  Cardiovascular: Normal rate.  Pulmonary/Chest: Effort normal.  Abdominal: Normal appearance and bowel sounds are normal. She exhibits no distension. No hernia.  Musculoskeletal: Normal range of motion.  Neurological: She is alert and oriented to person, place, and time.  Skin: Skin is warm.  Psychiatric: She has a normal mood and affect.  Nursing note and vitals reviewed.    ED Treatments / Results  Labs (all labs ordered are listed, but only abnormal results are displayed) Labs Reviewed  COMPREHENSIVE  METABOLIC PANEL - Abnormal; Notable for the following components:      Result Value   Chloride 100 (*)    Glucose, Bld 137 (*)    Creatinine, Ser 1.02 (*)    All other components within normal limits  URINALYSIS, ROUTINE W REFLEX MICROSCOPIC - Abnormal; Notable for the following components:   Protein, ur 30 (*)    Squamous Epithelial / LPF 0-5 (*)    All other components within normal limits  LIPASE, BLOOD  CBC    EKG  EKG Interpretation None       Radiology US Abdomen Complete  Result Date: 06/22/2017 CLINICAL DATA:  One week history of abdominal pain. EXAM: ABDOMEN ULTRASOUND COMPLETE COMPARISON:  None. FINDINGS: Gallbladder: No gallstones or gallbladder wall thickening. No pericholecystic fluid. The sonographer reports no sonographic Murphy's sign. Common bile duct: Diameter: 12 mm Liver: No focal lesion identified. Within normal limits in parenchymal echogenicity. Portal vein is patent on color Doppler imaging with normal direction of blood flow towards the liver. IVC: No abnormality visualized. Pancreas: Pancreatic tail not well seen due to overlying bowel gas. Otherwise unremarkable. Spleen: Size and appearance within normal limits. Right Kidney: Length: 11.3 cm. Echogenicity within normal limits. No mass or hydronephrosis visualized. Left Kidney: Length: 10.3 cm. Echogenicity within normal limits. No mass or hydronephrosis visualized. Abdominal aorta: No aneurysm visualized. Other findings: None. IMPRESSION: 1. Dilated common duct measuring up to 12 mm. Etiology for the dilatation not evident by ultrasound. Correlation with liver function test may be indicated. CT evaluation of the abdomen and pelvis may prove helpful. 2. No evidence for cholelithiasis. Electronically Signed   By: Kennith Center M.D.   On: 06/22/2017 18:02    Procedures Procedures (including critical care time)  Medications Ordered in ED Medications  ondansetron (ZOFRAN-ODT) disintegrating tablet 4 mg (4 mg  Oral Given 06/22/17 1456)  gi cocktail (Maalox,Lidocaine,Donnatal) (30 mLs Oral Given 06/22/17 1542)     Initial Impression / Assessment and Plan / ED Course  I have reviewed the triage vital signs and the nursing notes.  Pertinent labs & imaging results that were available during my care of the patient were reviewed by me and considered in my medical decision making (see chart for details).  Clinical Course as of Jun 22 1816  Thu Jun 22, 2017  1547 Creatinine: (!) 1.02 [HM]    Clinical Course User Index [HM] Estill Dooms, Student-PA    Pt has slight increase in creatine and protein in her urine.  Pt advised here primary Md needs to follow her closely for kidney disease.  I will start pt on a ppi  To help with discomfort.    Final Clinical Impressions(s) / ED Diagnoses   Final diagnoses:  Gastritis without bleeding,  unspecified chronicity, unspecified gastritis type    ED Discharge Orders        Ordered    omeprazole (PRILOSEC) 20 MG capsule  Daily     06/22/17 1825    An After Visit Summary was printed and given to the patient.    Elson Areas, PA-C 06/22/17 Lauretta Chester    Rolland Porter, MD 07/04/17 [phone]

## 2017-06-22 NOTE — ED Triage Notes (Signed)
To ED for eval of abd pain and cramping for past week after she eats. No diarrhea. Vomiting Tuesday evening. Nausea. Denies vaginal discharge or difficulty with urination

## 2017-08-28 NOTE — Progress Notes (Signed)
Cardiology Office Note   Date:  08/31/2017   ID:  [name], DOB September 16, 1965, MRN [id number]  PCP:  [name], MD  Cardiologist:   Charlton Haws, MD   No chief complaint on file.     History of Present Illness: Teresa [name] is a 52 y.o. female who presents for f/u non ischemic DCM Seen in hospital as new consult October 2018 History of HTN non compliant with meds, smoking Echo with EF 25-30% diffuse hypokinesis mild MR Trying to quit smoking on zyban Still having one during her breaks at [facility]. Weight is [name] Compliant with meds 06/2017 HCTZ and hydralazine  added for BP control   High. Her husband Teresa [name] is patient of [facility] Som has stents. They are [employer] fans  She has not been taking her meds regularly or spaced out She does not want to try more expensive meds I.e. Entresto She is still drinking too much beer ([place]) Discussed how to take her meds and toxicity of ETOH to heart   Past Medical History:  Diagnosis Date  . Abnormal EKG   . Dyspnea   . Elevated troponin   . HTN (hypertension)   . Leg edema   . Tobacco abuse     History reviewed. No pertinent surgical history.   Current Outpatient Medications  Medication Sig Dispense Refill  . aspirin 81 MG tablet Take 4 tablets (325 mg total) by mouth daily. 30 tablet 0  . buPROPion (ZYBAN) 150 MG 12 hr tablet Take 1 tablet daily for 3 days.  On day 4 take 1 tablet twice a day 60 tablet 5  . hydrALAZINE (APRESOLINE) 50 MG tablet Take 1 tablet (50 mg total) 3 (three) times daily by mouth. 270 tablet 3  . ibuprofen (ADVIL,MOTRIN) 200 MG tablet Take 600 mg by mouth every 8 (eight) hours as needed for moderate pain.    Marland Kitchen losartan-hydrochlorothiazide (HYZAAR) 100-12.5 MG tablet Take 1 tablet daily by mouth. 90 tablet 3  . metoprolol succinate (TOPROL-XL) 25 MG 24 hr tablet Take 1 tablet (25 mg total) daily by mouth. 90 tablet 3  . omeprazole (PRILOSEC) 20 MG capsule Take 1 capsule (20 mg total) daily by  mouth. 30 capsule 1  . Pseudoephedrine-APAP-DM (DAYQUIL PO) Take 2 capsules by mouth as needed.     No current facility-administered medications for this visit.     Allergies:   Patient has no known allergies.    Social History:  The patient  reports that she has been smoking.  She has been smoking about 0.25 packs per day. she has never used smokeless tobacco. She reports that she drinks alcohol. She reports that she does not use drugs.   Family History:  The patient's family history includes CAD in her mother; Diabetes in her mother.    ROS:  Please see the history of present illness.   Otherwise, review of systems are positive for none.   All other systems are reviewed and negative.    PHYSICAL EXAM: VS:  BP (!) 160/102   Pulse 71   Ht 5\' 6"  (1.676 m)   Wt 222 lb (100.7 kg)   SpO2 97%   BMI 35.83 kg/m  , BMI Body mass index is 35.83 kg/m. Affect appropriate Healthy:  appears stated age HEENT: normal Neck supple with no adenopathy JVP normal no bruits no thyromegaly Lungs clear with no wheezing and good diaphragmatic motion Heart:  S1/S2 no murmur, no rub, gallop or click PMI normal Abdomen:  benighn, BS positve, no tenderness, no AAA no bruit.  No HSM or HJR Distal pulses intact with no bruits No edema Neuro non-focal Skin warm and dry No muscular weakness     EKG:  SR nonspecific ST/T wave changes 05/12/17   Recent Labs: 05/11/2017: B Natriuretic Peptide 328.7 06/22/2017: ALT 19; BUN 20; Creatinine, Ser 1.02; Hemoglobin 12.5; Platelets 234; Potassium 4.0; Sodium 136    Lipid Panel    Component Value Date/Time   CHOL 140 05/11/2017 1752   TRIG 120 05/11/2017 1752   HDL 56 05/11/2017 1752   CHOLHDL 2.5 05/11/2017 1752   VLDL 24 05/11/2017 1752   LDLCALC 60 05/11/2017 1752      Wt Readings from Last 3 Encounters:  08/31/17 222 lb (100.7 kg)  06/15/17 213 lb (96.6 kg)  05/18/17 215 lb 3 oz (97.6 kg)      Other studies Reviewed: Additional studies/  records that were reviewed today include: [name] office notes October. Hospital admissionl labs ECG, CXR and echo .  Echo 05/12/17 Study Conclusions  - Left ventricle: The cavity size was moderately dilated. There was   moderate concentric hypertrophy. Systolic function was severely   reduced. The estimated ejection fraction was in the range of 25%   to 30%. Severe diffuse hypokinesis with no identifiable regional   variations. Features are consistent with a pseudonormal left   ventricular filling pattern, with concomitant abnormal relaxation   and increased filling pressure (grade 2 diastolic dysfunction).   Doppler parameters are consistent with high ventricular filling   pressure. - Aortic valve: Valve area (VTI): 2.57 cm^2. Valve area (Vmax): 2.7   cm^2. Valve area (Vmean): 2.49 cm^2. - Mitral valve: There was mild regurgitation. - Left atrium: The atrium was mildly dilated. - Pulmonic valve: There was trivial regurgitation. - Pericardium, extracardiac: A trivial, free-flowing pericardial   effusion was identified along the right atrial free wall. The   fluid had no internal echoes. There was mildright atrial chamber   collapse for less than 50% of the cardiac cycle.   ASSESSMENT AND PLAN:  1.  CHF: non compliant with meds discussed an wrote out pill chart f/u 3 months no need to repeat echo until she has Been on good medical Rx  2. HTN  Poorly controlled due to ETOH and non compliance discussed  3. Smoking on zyban cutting back doing better   F/U 3 months   Charlton Haws

## 2017-08-31 ENCOUNTER — Ambulatory Visit: Payer: 59 | Admitting: Cardiovascular Disease

## 2017-08-31 ENCOUNTER — Encounter: Payer: Self-pay | Admitting: Cardiovascular Disease

## 2017-08-31 VITALS — BP 160/102 | HR 71 | Ht 66.0 in | Wt 222.0 lb

## 2017-08-31 DIAGNOSIS — I5022 Chronic systolic (congestive) heart failure: Secondary | ICD-10-CM

## 2017-08-31 NOTE — Patient Instructions (Addendum)
Medication Instructions:  Your physician recommends that you continue on your current medications as directed. Please refer to the Current Medication list given to you today. Take Metoprolol in the mornings Take Hyzaar in the afternoons  Labwork: NONE  Testing/Procedures: NONE  Follow-Up: Your physician wants you to follow-up in: 3 months with Dr. Eden Emms.    If you need a refill on your cardiac medications before your next appointment, please call your pharmacy.

## 2017-11-08 ENCOUNTER — Encounter: Payer: Self-pay | Admitting: Physician Assistant

## 2017-11-13 ENCOUNTER — Other Ambulatory Visit: Payer: Self-pay | Admitting: Cardiovascular Disease

## 2017-11-13 DIAGNOSIS — Z1231 Encounter for screening mammogram for malignant neoplasm of breast: Secondary | ICD-10-CM

## 2017-11-20 NOTE — Progress Notes (Signed)
Cardiology Office Note   Date:  11/23/2017   ID:  Teresa [name], DOB [birth year], MRN [id number]  PCP:  [name], MD  Cardiologist:   Charlton Haws, MD   No chief complaint on file.     History of Present Illness:  52 y.o. first seen in consult 05/11/17.  History of HTN and smoking. Non compliant with meds when seen 3 months ago.  Echo 05/12/17 reviewed with EF 25-30% diffuse hypokinesis mild MR Trying to quit smoking on zyban Still having one during her breaks at [facility]. Weight is down   High. Her husband [name] is patient of [facility] Som has stents. They are [employer] fans  Does not want to try entresto worried about expense Still drinking beer   She continues to be non compliant with meds smoke and drink beer   Past Medical History:  Diagnosis Date  . Abnormal EKG   . Dyspnea   . Elevated troponin   . HTN (hypertension)   . Leg edema   . Tobacco abuse     History reviewed. No pertinent surgical history.   Current Outpatient Medications  Medication Sig Dispense Refill  . buPROPion (ZYBAN) 150 MG 12 hr tablet Take 1 tablet daily for 3 days.  On day 4 take 1 tablet twice a day 60 tablet 5  . hydrALAZINE (APRESOLINE) 50 MG tablet Take 1 tablet (50 mg total) 3 (three) times daily by mouth. 270 tablet 3  . ibuprofen (ADVIL,MOTRIN) 200 MG tablet Take 600 mg by mouth every 8 (eight) hours as needed for moderate pain.    Marland Kitchen losartan-hydrochlorothiazide (HYZAAR) 100-12.5 MG tablet Take 1 tablet daily by mouth. 90 tablet 3  . metoprolol succinate (TOPROL-XL) 25 MG 24 hr tablet Take 1 tablet (25 mg total) daily by mouth. 90 tablet 3  . omeprazole (PRILOSEC) 20 MG capsule Take 1 capsule (20 mg total) daily by mouth. 30 capsule 1   No current facility-administered medications for this visit.     Allergies:   Patient has no known allergies.    Social History:  The patient  reports that she has been smoking.  She has been smoking about 0.25 packs per day. She has never  used smokeless tobacco. She reports that she drinks alcohol. She reports that she does not use drugs.   Family History:  The patient's family history includes CAD in her mother; Diabetes in her mother.    ROS:  Please see the history of present illness.   Otherwise, review of systems are positive for none.   All other systems are reviewed and negative.    PHYSICAL EXAM: VS:  BP (!) 172/92   Pulse 72   Ht 5\' 6"  (1.676 m)   Wt 225 lb 12 oz (102.4 kg)   BMI 36.44 kg/m  , BMI Body mass index is 36.44 kg/m. Affect appropriate Overweight female  HEENT: normal Neck supple with no adenopathy JVP normal no bruits no thyromegaly Lungs clear with no wheezing and good diaphragmatic motion Heart:  S1/S2 no murmur, no rub, gallop or click PMI normal Abdomen: benighn, BS positve, no tenderness, no AAA no bruit.  No HSM or HJR Distal pulses intact with no bruits No edema Neuro non-focal Skin warm and dry No muscular weakness   EKG:  SR nonspecific ST/T wave changes 05/12/17   Recent Labs: 05/11/2017: B Natriuretic Peptide 328.7 06/22/2017: ALT 19; BUN 20; Creatinine, Ser 1.02; Hemoglobin 12.5; Platelets 234; Potassium 4.0; Sodium 136  Lipid Panel    Component Value Date/Time   CHOL 140 05/11/2017 1752   TRIG 120 05/11/2017 1752   HDL 56 05/11/2017 1752   CHOLHDL 2.5 05/11/2017 1752   VLDL 24 05/11/2017 1752   LDLCALC 60 05/11/2017 1752      Wt Readings from Last 3 Encounters:  11/23/17 225 lb 12 oz (102.4 kg)  08/31/17 222 lb (100.7 kg)  06/15/17 213 lb (96.6 kg)      Other studies Reviewed: Additional studies/ records that were reviewed today include: [name] office notes October. Hospital admissionl labs ECG, CXR and echo .  Echo 05/12/17 Study Conclusions  - Left ventricle: The cavity size was moderately dilated. There was   moderate concentric hypertrophy. Systolic function was severely   reduced. The estimated ejection fraction was in the range of 25%    to 30%. Severe diffuse hypokinesis with no identifiable regional   variations. Features are consistent with a pseudonormal left   ventricular filling pattern, with concomitant abnormal relaxation   and increased filling pressure (grade 2 diastolic dysfunction).   Doppler parameters are consistent with high ventricular filling   pressure. - Aortic valve: Valve area (VTI): 2.57 cm^2. Valve area (Vmax): 2.7   cm^2. Valve area (Vmean): 2.49 cm^2. - Mitral valve: There was mild regurgitation. - Left atrium: The atrium was mildly dilated. - Pulmonic valve: There was trivial regurgitation. - Pericardium, extracardiac: A trivial, free-flowing pericardial   effusion was identified along the right atrial free wall. The   fluid had no internal echoes. There was mildright atrial chamber   collapse for less than 50% of the cardiac cycle.   ASSESSMENT AND PLAN:  1.  CHF: surprisingly asymptomatic despite non compliance and poor habits continued To stress importance of med compliance, d/c ETOH and low sodium diet 2. HTN  Poorly controlled due to ETOH and non compliance discussed  3. Smoking on zyban cutting back doing better   F/U 6 months  Charlton Haws

## 2017-11-23 ENCOUNTER — Ambulatory Visit: Payer: 59 | Admitting: Cardiovascular Disease

## 2017-11-23 ENCOUNTER — Encounter: Payer: Self-pay | Admitting: Cardiovascular Disease

## 2017-11-23 VITALS — BP 172/92 | HR 72 | Ht 66.0 in | Wt 225.8 lb

## 2017-11-23 DIAGNOSIS — I5043 Acute on chronic combined systolic (congestive) and diastolic (congestive) heart failure: Secondary | ICD-10-CM | POA: Diagnosis not present

## 2017-11-23 NOTE — Patient Instructions (Signed)

## 2017-12-08 ENCOUNTER — Ambulatory Visit
Admission: RE | Admit: 2017-12-08 | Discharge: 2017-12-08 | Disposition: A | Discharge status: Home / Self Care | Payer: 59 | Source: Ambulatory Visit | Attending: Cardiovascular Disease | Admitting: Cardiovascular Disease

## 2017-12-08 DIAGNOSIS — Z1231 Encounter for screening mammogram for malignant neoplasm of breast: Secondary | ICD-10-CM

## 2018-01-08 ENCOUNTER — Ambulatory Visit (INDEPENDENT_AMBULATORY_CARE_PROVIDER_SITE_OTHER): Payer: 59 | Admitting: Family Medicine

## 2018-01-08 ENCOUNTER — Encounter: Payer: Self-pay | Admitting: Family Medicine

## 2018-01-08 ENCOUNTER — Ambulatory Visit (INDEPENDENT_AMBULATORY_CARE_PROVIDER_SITE_OTHER)
Admission: RE | Admit: 2018-01-08 | Discharge: 2018-01-08 | Disposition: A | Discharge status: Home / Self Care | Payer: 59 | Source: Ambulatory Visit | Attending: Family Medicine | Admitting: Family Medicine

## 2018-01-08 VITALS — BP 148/78 | HR 92 | Temp 98.1°F | Wt 228.0 lb

## 2018-01-08 DIAGNOSIS — Z1211 Encounter for screening for malignant neoplasm of colon: Secondary | ICD-10-CM | POA: Diagnosis not present

## 2018-01-08 DIAGNOSIS — R252 Cramp and spasm: Secondary | ICD-10-CM | POA: Diagnosis not present

## 2018-01-08 DIAGNOSIS — G629 Polyneuropathy, unspecified: Secondary | ICD-10-CM | POA: Diagnosis not present

## 2018-01-08 DIAGNOSIS — M79652 Pain in left thigh: Secondary | ICD-10-CM | POA: Diagnosis not present

## 2018-01-08 DIAGNOSIS — M47816 Spondylosis without myelopathy or radiculopathy, lumbar region: Secondary | ICD-10-CM | POA: Diagnosis not present

## 2018-01-08 DIAGNOSIS — M25552 Pain in left hip: Secondary | ICD-10-CM | POA: Diagnosis not present

## 2018-01-08 NOTE — Patient Instructions (Signed)
Potassium Content of Foods Potassium is a mineral found in many foods and drinks. It helps keep fluids and minerals balanced in your body and affects how steadily your heart beats. Potassium also helps control your blood pressure and keep your muscles and nervous system healthy. Certain health conditions and medicines may change the balance of potassium in your body. When this happens, you can help balance your level of potassium through the foods that you do or do not eat. Your health care provider or dietitian may recommend an amount of potassium that you should have each day. The following lists of foods provide the amount of potassium (in parentheses) per serving in each item. High in potassium The following foods and beverages have 200 mg or more of potassium per serving:  Apricots, 2 raw or 5 dry (200 mg).  Artichoke, 1 medium (345 mg).  Avocado, raw,  each (245 mg).  Banana, 1 medium (425 mg).  Beans, lima, or baked beans, canned,  cup (280 mg).  Beans, white, canned,  cup (595 mg).  Beef roast, 3 oz (320 mg).  Beef, ground, 3 oz (270 mg).  Beets, raw or cooked,  cup (260 mg).  Bran muffin, 2 oz (300 mg).  Broccoli,  cup (230 mg).  Brussels sprouts,  cup (250 mg).  Cantaloupe,  cup (215 mg).  Cereal, 100% bran,  cup (200-400 mg).  Cheeseburger, single, fast food, 1 each (225-400 mg).  Chicken, 3 oz (220 mg).  Clams, canned, 3 oz (535 mg).  Crab, 3 oz (225 mg).  Dates, 5 each (270 mg).  Dried beans and peas,  cup (300-475 mg).  Figs, dried, 2 each (260 mg).  Fish: halibut, tuna, cod, snapper, 3 oz (480 mg).  Fish: salmon, haddock, swordfish, perch, 3 oz (300 mg).  Fish, tuna, canned 3 oz (200 mg).  [place] fries, fast food, 3 oz (470 mg).  Granola with fruit and nuts,  cup (200 mg).  Grapefruit juice,  cup (200 mg).  Greens, beet,  cup (655 mg).  Honeydew melon,  cup (200 mg).  Kale, raw, 1 cup (300 mg).  Kiwi, 1 medium (240  mg).  Kohlrabi, rutabaga, parsnips,  cup (280 mg).  Lentils,  cup (365 mg).  Mango, 1 each (325 mg).  Milk, chocolate, 1 cup (420 mg).  Milk: nonfat, low-fat, whole, buttermilk, 1 cup (350-380 mg).  Molasses, 1 Tbsp (295 mg).  Mushrooms,  cup (280) mg.  Nectarine, 1 each (275 mg).  Nuts: almonds, peanuts, hazelnuts, [place], cashew, mixed, 1 oz (200 mg).  Nuts, pistachios, 1 oz (295 mg).  Orange, 1 each (240 mg).  Orange juice,  cup (235 mg).  Papaya, medium,  fruit (390 mg).  Peanut butter, chunky, 2 Tbsp (240 mg).  Peanut butter, smooth, 2 Tbsp (210 mg).  Pear, 1 medium (200 mg).  Pomegranate, 1 whole (400 mg).  Pomegranate juice,  cup (215 mg).  Pork, 3 oz (350 mg).  Potato chips, salted, 1 oz (465 mg).  Potato, baked with skin, 1 medium (925 mg).  Potatoes, boiled,  cup (255 mg).  Potatoes, mashed,  cup (330 mg).  Prune juice,  cup (370 mg).  Prunes, 5 each (305 mg).  Pudding, chocolate,  cup (230 mg).  Pumpkin, canned,  cup (250 mg).  Raisins, seedless,  cup (270 mg).  Seeds, sunflower or pumpkin, 1 oz (240 mg).  Soy milk, 1 cup (300 mg).  Spinach,  cup (420 mg).  Spinach, canned,  cup (370 mg).  Sweet  potato, baked with skin, 1 medium (450 mg).  Swiss chard,  cup (480 mg).  Tomato or vegetable juice,  cup (275 mg).  Tomato sauce or puree,  cup (400-550 mg).  Tomato, raw, 1 medium (290 mg).  Tomatoes, canned,  cup (200-300 mg).  [place], 3 oz (250 mg).  Wheat germ, 1 oz (250 mg).  Winter squash,  cup (250 mg).  Yogurt, plain or fruited, 6 oz (260-435 mg).  Zucchini,  cup (220 mg).  Moderate in potassium The following foods and beverages have 50-200 mg of potassium per serving:  Apple, 1 each (150 mg).  Apple juice,  cup (150 mg).  Applesauce,  cup (90 mg).  Apricot nectar,  cup (140 mg).  Asparagus, small spears,  cup or 6 spears (155 mg).  Bagel, cinnamon raisin, 1 each (130 mg).  Bagel,  egg or plain, 4 in., 1 each (70 mg).  Beans, green,  cup (90 mg).  Beans, yellow,  cup (190 mg).  Beer, regular, 12 oz (100 mg).  Beets, canned,  cup (125 mg).  Blackberries,  cup (115 mg).  Blueberries,  cup (60 mg).  Bread, whole wheat, 1 slice (70 mg).  Broccoli, raw,  cup (145 mg).  Cabbage,  cup (150 mg).  Carrots, cooked or raw,  cup (180 mg).  Cauliflower, raw,  cup (150 mg).  Celery, raw,  cup (155 mg).  Cereal, bran flakes, cup (120-150 mg).  Cheese, cottage,  cup (110 mg).  Cherries, 10 each (150 mg).  Chocolate, 1 oz bar (165 mg).  Coffee, brewed 6 oz (90 mg).  Corn,  cup or 1 ear (195 mg).  Cucumbers,  cup (80 mg).  Egg, large, 1 each (60 mg).  Eggplant,  cup (60 mg).  Endive, raw, cup (80 mg).  English muffin, 1 each (65 mg).  Fish, orange roughy, 3 oz (150 mg).  Frankfurter, beef or pork, 1 each (75 mg).  Fruit cocktail,  cup (115 mg).  Grape juice,  cup (170 mg).  Grapefruit,  fruit (175 mg).  Grapes,  cup (155 mg).  Greens: kale, turnip, collard,  cup (110-150 mg).  Ice cream or frozen yogurt, chocolate,  cup (175 mg).  Ice cream or frozen yogurt, vanilla,  cup (120-150 mg).  Lemons, limes, 1 each (80 mg).  Lettuce, all types, 1 cup (100 mg).  Mixed vegetables,  cup (150 mg).  Mushrooms, raw,  cup (110 mg).  Nuts: walnuts, pecans, or macadamia, 1 oz (125 mg).  Oatmeal,  cup (80 mg).  Okra,  cup (110 mg).  Onions, raw,  cup (120 mg).  Peach, 1 each (185 mg).  Peaches, canned,  cup (120 mg).  Pears, canned,  cup (120 mg).  Peas, green, frozen,  cup (90 mg).  Peppers, green,  cup (130 mg).  Peppers, red,  cup (160 mg).  Pineapple juice,  cup (165 mg).  Pineapple, fresh or canned,  cup (100 mg).  Plums, 1 each (105 mg).  Pudding, vanilla,  cup (150 mg).  Raspberries,  cup (90 mg).  Rhubarb,  cup (115 mg).  Rice, wild,  cup (80 mg).  Shrimp, 3 oz (155  mg).  Spinach, raw, 1 cup (170 mg).  Strawberries,  cup (125 mg).  Summer squash  cup (175-200 mg).  Swiss chard, raw, 1 cup (135 mg).  Tangerines, 1 each (140 mg).  Tea, brewed, 6 oz (65 mg).  Turnips,  cup (140 mg).  Watermelon,  cup (85 mg).  Wine, red, table,  5 oz (180 mg).  Wine, white, table, 5 oz (100 mg).  Low in potassium The following foods and beverages have less than 50 mg of potassium per serving.  Bread, white, 1 slice (30 mg).  Carbonated beverages, 12 oz (less than 5 mg).  Cheese, 1 oz (20-30 mg).  Cranberries,  cup (45 mg).  Cranberry juice cocktail,  cup (20 mg).  Fats and oils, 1 Tbsp (less than 5 mg).  Hummus, 1 Tbsp (32 mg).  Nectar: papaya, mango, or pear,  cup (35 mg).  Rice, white or brown,  cup (50 mg).  Spaghetti or macaroni,  cup cooked (30 mg).  Tortilla, flour or corn, 1 each (50 mg).  Waffle, 4 in., 1 each (50 mg).  Water chestnuts,  cup (40 mg).  This information is not intended to replace advice given to you by your health care provider. Make sure you discuss any questions you have with your health care provider. Document Released: 03/08/2005 Document Revised: 12/31/2015 Document Reviewed: 06/21/2013 Elsevier Interactive Patient Education  2018 [employer].  Peripheral Neuropathy Peripheral neuropathy is a type of nerve damage. It affects nerves that carry signals between the spinal cord and other parts of the body. These are called peripheral nerves. With peripheral neuropathy, one nerve or a group of nerves may be damaged. What are the causes? Many things can damage peripheral nerves. For some people with peripheral neuropathy, the cause is unknown. Some causes include:  Diabetes. This is the most common cause of peripheral neuropathy.  Injury to a nerve.  Pressure or stress on a nerve that lasts a long time.  Too little vitamin B. Alcoholism can lead to this.  Infections.  Autoimmune diseases, such as  multiple sclerosis and systemic lupus erythematosus.  Inherited nerve diseases.  Some medicines, such as cancer drugs.  Toxic substances, such as lead and mercury.  Too little blood flowing to the legs.  Kidney disease.  Thyroid disease.  What are the signs or symptoms? Different people have different symptoms. The symptoms you have will depend on which of your nerves is damaged. Common symptoms include:  Loss of feeling (numbness) in the feet and hands.  Tingling in the feet and hands.  Pain that burns.  Very sensitive skin.  Weakness.  Not being able to move a part of the body (paralysis).  Muscle twitching.  Clumsiness or poor coordination.  Loss of balance.  Not being able to control your bladder.  Feeling dizzy.  Sexual problems.  How is this diagnosed? Peripheral neuropathy is a symptom, not a disease. Finding the cause of peripheral neuropathy can be hard. To figure that out, your health care provider will take a medical history and do a physical exam. A neurological exam will also be done. This involves checking things affected by your brain, spinal cord, and nerves (nervous system). For example, your health care provider will check your reflexes, how you move, and what you can feel. Other types of tests may also be ordered, such as:  Blood tests.  A test of the fluid in your spinal cord.  Imaging tests, such as CT scans or an MRI.  Electromyography (EMG). This test checks the nerves that control muscles.  Nerve conduction velocity tests. These tests check how fast messages pass through your nerves.  Nerve biopsy. A small piece of nerve is removed. It is then checked under a microscope.  How is this treated?  Medicine is often used to treat peripheral neuropathy. Medicines may include: ?  Pain-relieving medicines. Prescription or over-the-counter medicine may be suggested. ? Antiseizure medicine. This may be used for pain. ? Antidepressants. These  also may help ease pain from neuropathy. ? Lidocaine. This is a numbing medicine. You might wear a patch or be given a shot. ? Mexiletine. This medicine is typically used to help control irregular heart rhythms.  Surgery. Surgery may be needed to relieve pressure on a nerve or to destroy a nerve that is causing pain.  Physical therapy to help movement.  Assistive devices to help movement. Follow these instructions at home:  Only take over-the-counter or prescription medicines as directed by your health care provider. Follow the instructions carefully for any given medicines. Do not take any other medicines without first getting approval from your health care provider.  If you have diabetes, work closely with your health care provider to keep your blood sugar under control.  If you have numbness in your feet: ? Check every day for signs of injury or infection. Watch for redness, warmth, and swelling. ? Wear padded socks and comfortable shoes. These help protect your feet.  Do not do things that put pressure on your damaged nerve.  Do not smoke. Smoking keeps blood from getting to damaged nerves.  Avoid or limit alcohol. Too much alcohol can cause a lack of B vitamins. These vitamins are needed for healthy nerves.  Develop a good support system. Coping with peripheral neuropathy can be stressful. Talk to a mental health specialist or join a support group if you are struggling.  Follow up with your health care provider as directed. Contact a health care provider if:  You have new signs or symptoms of peripheral neuropathy.  You are struggling emotionally from dealing with peripheral neuropathy.  You have a fever. Get help right away if:  You have an injury or infection that is not healing.  You feel very dizzy or begin vomiting.  You have chest pain.  You have trouble breathing. This information is not intended to replace advice given to you by your health care provider. Make  sure you discuss any questions you have with your health care provider. Document Released: 07/15/2002 Document Revised: 12/31/2015 Document Reviewed: 04/01/2013 Elsevier Interactive Patient Education  2017 [employer].

## 2018-01-08 NOTE — Progress Notes (Signed)
Subjective:    Patient ID: Teresa [name], female    DOB: 09/06/1965, 52 y.o.   MRN: [id number]  No chief complaint on file. Pt accompanied by her husband.  HPI Patient was seen today for acute concern.  Pt endorses left thigh burning/dull pain x1 month.  Pt also notes pain in her groin.  When the feeling initially started pt endorsed numbness on the top of her L foot.  That then turned into lateral L thigh and knee pain with Pt denies any back pain.  Pt has not taken anything for her symptoms.  Pt states she is drinking plenty of water.  Pt endorses muscle cramps at night.  Pt drinking plenty of water and trying to stretch.  Past Medical History:  Diagnosis Date  . Abnormal EKG   . Dyspnea   . Elevated troponin   . HTN (hypertension)   . Leg edema   . Tobacco abuse     No Known Allergies  ROS General: Denies fever, chills, night sweats, changes in weight, changes in appetite HEENT: Denies headaches, ear pain, changes in vision, rhinorrhea, sore throat CV: Denies CP, palpitations, SOB, orthopnea Pulm: Denies SOB, cough, wheezing GI: Denies abdominal pain, nausea, vomiting, diarrhea, constipation GU: Denies dysuria, hematuria, frequency, vaginal discharge Msk: Denies  joint pains  +muscle cramps Neuro: Denies weakness, numbness, tingling  +burning in L thigh and groin Skin: Denies rashes, bruising Psych: Denies depression, anxiety, hallucinations     Objective:    Blood pressure (!) 148/78, pulse 92, temperature 98.1 F (36.7 C), temperature source Oral, weight 228 lb (103.4 kg), SpO2 96 %.   Gen. Pleasant, well-nourished, in no distress, normal affect   HEENT: /AT, face symmetric, no scleral icterus, PERRLA,nares patent without drainage Lungs: no accessory muscle use, CTAB, no wheezes or rales Cardiovascular: RRR, no m/r/g, no peripheral edema.  Peripheral pulses 2+ Musculoskeletal: No deformities, no cyanosis or clubbing, normal tone.  Normal strength in LEs b/l.   Negative log roll, FABIR, straight leg raise.  pain with FADER in L hip. Neuro:  A&Ox3, CN II-XII intact, normal gait    Wt Readings from Last 3 Encounters:  01/08/18 228 lb (103.4 kg)  11/23/17 225 lb 12 oz (102.4 kg)  08/31/17 222 lb (100.7 kg)    Lab Results  Component Value Date   WBC 4.9 06/22/2017   HGB 12.5 06/22/2017   HCT 40.3 06/22/2017   PLT 234 06/22/2017   GLUCOSE 137 (H) 06/22/2017   CHOL 140 05/11/2017   TRIG 120 05/11/2017   HDL 56 05/11/2017   LDLCALC 60 05/11/2017   ALT 19 06/22/2017   AST 21 06/22/2017   NA 136 06/22/2017   K 4.0 06/22/2017   CL 100 (L) 06/22/2017   CREATININE 1.02 (H) 06/22/2017   BUN 20 06/22/2017   CO2 30 06/22/2017    Assessment/Plan:  Neuropathy  -Discussed possible causes including nerve compression, decreased perfusion, DM (no h/o), etc  -consider gabapentin  - Plan: DG Lumbar Spine Complete, DG HIP UNILAT WITH PELVIS 2-3 VIEWS LEFT  Pain of left thigh  -likely 2/2 neuropathy from nerve compression in Lumbar spine, narrowing of femoral blood vessels. - Plan: DG Lumbar Spine Complete, DG HIP UNILAT WITH PELVIS 2-3 VIEWS LEFT -discussed further work up may be needed.  Muscle cramps -Possible causes -Patient encouraged to increase intake of potassium rich foods, drink plenty of water, and stretch. -Patient can try a teaspoon of yellow mustard -Given handout  Note given for work.  F/u prn  Grier Mitts, MD

## 2018-01-10 ENCOUNTER — Other Ambulatory Visit: Payer: Self-pay

## 2018-01-10 MED ORDER — GABAPENTIN 100 MG PO CAPS: 100.0000 mg | ORAL_CAPSULE | Freq: Three times a day (TID) | ORAL | 3 refills | Status: DC

## 2018-02-09 ENCOUNTER — Encounter: Payer: Self-pay | Admitting: Gastroenterology

## 2018-03-26 ENCOUNTER — Telehealth: Payer: Self-pay

## 2018-03-26 NOTE — Telephone Encounter (Signed)
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 She is a direct screening colonoscopy. Echo performed on 05/12/17 should an LVEF of 25- 30 %. Would you like for the patient to have an office visit or a direct hospital ? Please advise. Thanks!   Janalee Dane, LPN  ( PV )

## 2018-03-26 NOTE — Telephone Encounter (Signed)
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 She is scheduled for PV 04/02/18 and her colonoscopy is scheduled for [facility] 04/16/18/. Teresa [name] had an echo on 05-22-17. Echo showed LVEF 25-30 %. Would you like for her to have an office visit or a direct hospital? Please advise.  Thanks!    Teresa Dane, LPN ( PV )

## 2018-03-27 NOTE — Telephone Encounter (Signed)
Please schedule office visit next available. Thanks 

## 2018-03-28 ENCOUNTER — Encounter: Payer: Self-pay | Admitting: *Deleted

## 2018-03-28 NOTE — Telephone Encounter (Signed)
Spoke with patient. Notified her of the need for OV. Made office visit for 05/17/18 930 am 1st available. Colon and pv cancelled. Pt aware. Mailed appointment letter also.

## 2018-04-16 ENCOUNTER — Encounter: Payer: 59 | Admitting: Gastroenterology

## 2018-05-17 ENCOUNTER — Ambulatory Visit: Payer: 59 | Admitting: Gastroenterology

## 2018-06-23 ENCOUNTER — Other Ambulatory Visit: Payer: Self-pay | Admitting: Cardiovascular Disease

## 2018-08-03 NOTE — Progress Notes (Signed)
Cardiology Office Note   Date:  08/07/2018   ID:  Teresa [name], DOB Sep 06, 1965, MRN [id number]  PCP:  Deeann SaintBanks, Shannon R, MD  Cardiologist:   Charlton HawsPeter Karmyn Lowman, MD   No chief complaint on file.     History of Present Illness:  52 y.o. first seen in consult 05/11/17.  History of HTN and smoking. Non compliant with meds   Echo 05/12/17 reviewed with EF 25-30% diffuse hypokinesis mild MR Trying to quit smoking on zyban Still having one [facility]. Weight is down   Her husband Zoe Lanntonio is patient of [facility] has stents. They are [employer] fans  Does not want to try entresto worried about expense Still drinking beer   Has not had any meds for a month Discussed risk of stroke if BP unRx    Past Medical History:  Diagnosis Date  . Abnormal EKG   . Dyspnea   . Elevated troponin   . HTN (hypertension)   . Leg edema   . Tobacco abuse     No past surgical history on file.   Current Outpatient Medications  Medication Sig Dispense Refill  . gabapentin (NEURONTIN) 100 MG capsule Take 1 capsule (100 mg total) by mouth 3 (three) times daily. 90 capsule 3  . hydrALAZINE (APRESOLINE) 50 MG tablet Take 1 tablet (50 mg total) by mouth 3 (three) times daily. 90 tablet 11  . ibuprofen (ADVIL,MOTRIN) 200 MG tablet Take 600 mg by mouth every 8 (eight) hours as needed for moderate pain.    Marland Kitchen. losartan-hydrochlorothiazide (HYZAAR) 100-12.5 MG tablet Take 1 tablet by mouth daily. 30 tablet 11  . metoprolol succinate (TOPROL-XL) 25 MG 24 hr tablet Take 1 tablet (25 mg total) by mouth daily. 30 tablet 11  . omeprazole (PRILOSEC) 20 MG capsule Take 1 capsule (20 mg total) daily by mouth. 30 capsule 1   No current facility-administered medications for this visit.     Allergies:   Patient has no known allergies.    Social History:  The patient  reports that she has been smoking. She has been smoking about 0.25 packs per day. She has never used smokeless tobacco. She reports current  alcohol use. She reports that she does not use drugs.   Family History:  The patient's family history includes CAD in her mother; Diabetes in her mother.    ROS:  Please see the history of present illness.   Otherwise, review of systems are positive for none.   All other systems are reviewed and negative.    PHYSICAL EXAM: VS:  BP (!) 160/120   Pulse 79   Ht 5\' 6"  (1.676 m)   Wt 235 lb (106.6 kg)   BMI 37.93 kg/m  , BMI Body mass index is 37.93 kg/m. Affect appropriate Overweight female  HEENT: normal Neck supple with no adenopathy JVP normal no bruits no thyromegaly Lungs clear with no wheezing and good diaphragmatic motion Heart:  S1/S2 no murmur, no rub, gallop or click PMI enlarged  Abdomen: benighn, BS positve, no tenderness, no AAA no bruit.  No HSM or HJR Distal pulses intact with no bruits No edema Neuro non-focal Skin warm and dry No muscular weakness    EKG:   08/07/18 SR Rate 79 normal    Recent Labs: No results found for requested labs within last 8760 hours.    Lipid Panel    Component Value Date/Time   CHOL 140 05/11/2017 1752   TRIG 120 05/11/2017 1752  HDL 56 05/11/2017 1752   CHOLHDL 2.5 05/11/2017 1752   VLDL 24 05/11/2017 1752   LDLCALC 60 05/11/2017 1752      Wt Readings from Last 3 Encounters:  08/07/18 235 lb (106.6 kg)  01/08/18 228 lb (103.4 kg)  11/23/17 225 lb 12 oz (102.4 kg)      Other studies Reviewed: Additional studies/ records that were reviewed today include: [name] office notes October. Hospital admissionl labs ECG, CXR and echo .  Echo 05/12/17 Study Conclusions  - Left ventricle: The cavity size was moderately dilated. There was   moderate concentric hypertrophy. Systolic function was severely   reduced. The estimated ejection fraction was in the range of 25%   to 30%. Severe diffuse hypokinesis with no identifiable regional   variations. Features are consistent with a pseudonormal left   ventricular  filling pattern, with concomitant abnormal relaxation   and increased filling pressure (grade 2 diastolic dysfunction).   Doppler parameters are consistent with high ventricular filling   pressure. - Aortic valve: Valve area (VTI): 2.57 cm^2. Valve area (Vmax): 2.7   cm^2. Valve area (Vmean): 2.49 cm^2. - Mitral valve: There was mild regurgitation. - Left atrium: The atrium was mildly dilated. - Pulmonic valve: There was trivial regurgitation. - Pericardium, extracardiac: A trivial, free-flowing pericardial   effusion was identified along the right atrial free wall. The   fluid had no internal echoes. There was mildright atrial chamber   collapse for less than 50% of the cardiac cycle.   ASSESSMENT AND PLAN:  1.  CHF: surprisingly asymptomatic despite non compliance and poor habits continued To stress importance of med compliance, d/c ETOH and low sodium diet Update TTE for EF  2. HTN  Poorly controlled due to ETOH and non compliance discussed  3. Smoking on zyban cutting back doing better   F/U 6 months  Charlton Haws

## 2018-08-07 ENCOUNTER — Ambulatory Visit: Payer: 59 | Admitting: Cardiovascular Disease

## 2018-08-07 ENCOUNTER — Encounter: Payer: Self-pay | Admitting: Cardiovascular Disease

## 2018-08-07 VITALS — BP 160/120 | HR 79 | Ht 66.0 in | Wt 235.0 lb

## 2018-08-07 DIAGNOSIS — I5022 Chronic systolic (congestive) heart failure: Secondary | ICD-10-CM

## 2018-08-07 DIAGNOSIS — I10 Essential (primary) hypertension: Secondary | ICD-10-CM | POA: Diagnosis not present

## 2018-08-07 MED ORDER — METOPROLOL SUCCINATE ER 25 MG PO TB24: 25.0000 mg | ORAL_TABLET | Freq: Every day | ORAL | 11 refills | Status: DC

## 2018-08-07 MED ORDER — LOSARTAN POTASSIUM-HCTZ 100-12.5 MG PO TABS: 1.0000 | ORAL_TABLET | Freq: Every day | ORAL | 11 refills | Status: DC

## 2018-08-07 MED ORDER — HYDRALAZINE HCL 50 MG PO TABS: 50.0000 mg | ORAL_TABLET | Freq: Three times a day (TID) | ORAL | 11 refills | Status: DC

## 2018-08-07 NOTE — Patient Instructions (Signed)

## 2018-10-13 ENCOUNTER — Encounter (HOSPITAL_COMMUNITY): Payer: Self-pay | Admitting: Emergency Medicine

## 2018-10-13 ENCOUNTER — Emergency Department (HOSPITAL_COMMUNITY)
Admission: EM | Admit: 2018-10-13 | Discharge: 2018-10-14 | Disposition: A | Discharge status: Home / Self Care | Payer: 59 | Attending: Emergency Medicine | Admitting: Emergency Medicine

## 2018-10-13 ENCOUNTER — Emergency Department (HOSPITAL_COMMUNITY): Payer: 59

## 2018-10-13 ENCOUNTER — Other Ambulatory Visit: Payer: Self-pay

## 2018-10-13 DIAGNOSIS — I509 Heart failure, unspecified: Secondary | ICD-10-CM | POA: Diagnosis not present

## 2018-10-13 DIAGNOSIS — I110 Hypertensive heart disease with heart failure: Secondary | ICD-10-CM | POA: Insufficient documentation

## 2018-10-13 DIAGNOSIS — Z79899 Other long term (current) drug therapy: Secondary | ICD-10-CM | POA: Insufficient documentation

## 2018-10-13 DIAGNOSIS — R059 Cough, unspecified: Secondary | ICD-10-CM

## 2018-10-13 DIAGNOSIS — R079 Chest pain, unspecified: Secondary | ICD-10-CM | POA: Diagnosis not present

## 2018-10-13 DIAGNOSIS — R0602 Shortness of breath: Secondary | ICD-10-CM | POA: Diagnosis not present

## 2018-10-13 DIAGNOSIS — R05 Cough: Secondary | ICD-10-CM | POA: Diagnosis not present

## 2018-10-13 DIAGNOSIS — F17210 Nicotine dependence, cigarettes, uncomplicated: Secondary | ICD-10-CM | POA: Diagnosis not present

## 2018-10-13 HISTORY — DX: Heart failure, unspecified: I50.9

## 2018-10-13 NOTE — ED Triage Notes (Signed)
Pt c/o productive cough that started yesterday. Denies chest pain/shortness of breath.

## 2018-10-14 LAB — URINALYSIS, ROUTINE W REFLEX MICROSCOPIC
Bilirubin Urine: NEGATIVE
Glucose, UA: NEGATIVE mg/dL
HGB URINE DIPSTICK: NEGATIVE
Ketones, ur: NEGATIVE mg/dL
Leukocytes,Ua: NEGATIVE
Nitrite: NEGATIVE
PROTEIN: NEGATIVE mg/dL
SPECIFIC GRAVITY, URINE: 1.011 (ref 1.005–1.030)
pH: 5 (ref 5.0–8.0)

## 2018-10-14 LAB — CBC WITH DIFFERENTIAL/PLATELET
ABS IMMATURE GRANULOCYTES: 0.02 10*3/uL (ref 0.00–0.07)
Basophils Absolute: 0 10*3/uL (ref 0.0–0.1)
Basophils Relative: 1 %
Eosinophils Absolute: 0 10*3/uL (ref 0.0–0.5)
Eosinophils Relative: 0 %
HCT: 39.2 % (ref 36.0–46.0)
HEMOGLOBIN: 12.1 g/dL (ref 12.0–15.0)
IMMATURE GRANULOCYTES: 1 %
LYMPHS ABS: 1.8 10*3/uL (ref 0.7–4.0)
LYMPHS PCT: 42 %
MCH: 28.1 pg (ref 26.0–34.0)
MCHC: 30.9 g/dL (ref 30.0–36.0)
MCV: 91 fL (ref 80.0–100.0)
MONO ABS: 0.6 10*3/uL (ref 0.1–1.0)
MONOS PCT: 14 %
NEUTROS ABS: 1.8 10*3/uL (ref 1.7–7.7)
NRBC: 0 % (ref 0.0–0.2)
Neutrophils Relative %: 42 %
Platelets: 198 10*3/uL (ref 150–400)
RBC: 4.31 MIL/uL (ref 3.87–5.11)
RDW: 14.6 % (ref 11.5–15.5)
WBC: 4.3 10*3/uL (ref 4.0–10.5)

## 2018-10-14 LAB — COMPREHENSIVE METABOLIC PANEL
ALT: 19 U/L (ref 0–44)
AST: 32 U/L (ref 15–41)
Albumin: 3.7 g/dL (ref 3.5–5.0)
Alkaline Phosphatase: 89 U/L (ref 38–126)
Anion gap: 10 (ref 5–15)
BUN: 18 mg/dL (ref 6–20)
CHLORIDE: 102 mmol/L (ref 98–111)
CO2: 25 mmol/L (ref 22–32)
CREATININE: 1.87 mg/dL — AB (ref 0.44–1.00)
Calcium: 8.7 mg/dL — ABNORMAL LOW (ref 8.9–10.3)
GFR calc Af Amer: 35 mL/min — ABNORMAL LOW (ref >60)
GFR, EST NON AFRICAN AMERICAN: 30 mL/min — AB (ref >60)
Glucose, Bld: 114 mg/dL — ABNORMAL HIGH (ref 70–99)
POTASSIUM: 3.7 mmol/L (ref 3.5–5.1)
SODIUM: 137 mmol/L (ref 135–145)
Total Bilirubin: 0.7 mg/dL (ref 0.3–1.2)
Total Protein: 7.1 g/dL (ref 6.5–8.1)

## 2018-10-14 LAB — BRAIN NATRIURETIC PEPTIDE: B NATRIURETIC PEPTIDE 5: 11.2 pg/mL (ref 0.0–100.0)

## 2018-10-14 NOTE — ED Provider Notes (Signed)
[facility] EMERGENCY DEPARTMENT Provider Note   CSN: [id number] Arrival date & time: 10/13/18  2053    History   Chief Complaint Chief Complaint  Patient presents with  . Cough    HPI Teresa [name] is a 53 y.o. female.     53 y/o female with a PMH of CHF, HTN presents to the ED with a chief complaint of cough x 3 days.  Patient reports she has had a not dry or wet cough for the past 2 days.  With this cough she endorses some shortness of breath especially with coughing along with chest pain with the cough.  He has taken NyQuil which she reports no relieves in symptoms, has also taken allergy medication which seemed to help with her cough.  Also endorses some chills.  She reports compliance with her medications at home.  She denies any fever, nausea, vomiting, diarrhea or sore throat.  Patient is a very poor historian throughout this interview patient watching TV, states "you just need to look in my chart". Denies other complaints at this time.      Past Medical History:  Diagnosis Date  . Abnormal EKG   . CHF (congestive heart failure) (HCC)   . Dyspnea   . Elevated troponin   . HTN (hypertension)   . Leg edema   . Tobacco abuse     Patient Active Problem List   Diagnosis Date Noted  . Hypertension 05/11/2017  . Orthopnea 05/11/2017  . Leg edema 05/11/2017  . Anemia 05/11/2017  . Elevated troponin   . Dyspnea   . Abnormal EKG   . Tobacco abuse     History reviewed. No pertinent surgical history.   OB History   No obstetric history on file.      Home Medications    Prior to Admission medications   Medication Sig Start Date End Date Taking? Authorizing Provider  gabapentin (NEURONTIN) 100 MG capsule Take 1 capsule (100 mg total) by mouth 3 (three) times daily. 01/10/18   [name], MD  hydrALAZINE (APRESOLINE) 50 MG tablet Take 1 tablet (50 mg total) by mouth 3 (three) times daily. 08/07/18   Wendall Stade, MD  ibuprofen  (ADVIL,MOTRIN) 200 MG tablet Take 600 mg by mouth every 8 (eight) hours as needed for moderate pain.    [provider]  losartan-hydrochlorothiazide (HYZAAR) 100-12.5 MG tablet Take 1 tablet by mouth daily. 08/07/18   Wendall Stade, MD  metoprolol succinate (TOPROL-XL) 25 MG 24 hr tablet Take 1 tablet (25 mg total) by mouth daily. 08/07/18   Wendall Stade, MD  omeprazole (PRILOSEC) 20 MG capsule Take 1 capsule (20 mg total) daily by mouth. 06/22/17   Elson Areas, PA-C    Family History Family History  Problem Relation Age of Onset  . CAD Mother   . Diabetes Mother     Social History Social History   Tobacco Use  . Smoking status: Current Every Day Smoker    Packs/day: 0.25  . Smokeless tobacco: Never Used  Substance Use Topics  . Alcohol use: Yes    Comment: occ beer  . Drug use: No     Allergies   Patient has no known allergies.   Review of Systems Review of Systems  Constitutional: Negative for chills and fever.  HENT: Negative for ear pain and sore throat.   Eyes: Negative for pain and visual disturbance.  Respiratory: Positive for cough and shortness of breath.  Cardiovascular: Positive for chest pain. Negative for palpitations.  Gastrointestinal: Negative for abdominal pain and vomiting.  Genitourinary: Negative for dysuria and hematuria.  Musculoskeletal: Negative for arthralgias and back pain.  Skin: Negative for color change and rash.  Neurological: Negative for seizures and syncope.  All other systems reviewed and are negative.    Physical Exam Updated Vital Signs BP 111/72   Pulse 74   Temp 98.8 F (37.1 C) (Oral)   Resp 18   SpO2 94%   Physical Exam Vitals signs and nursing note reviewed.  Constitutional:      General: She is not in acute distress.    Appearance: She is well-developed.  HENT:     Head: Normocephalic and atraumatic.     Mouth/Throat:     Pharynx: No oropharyngeal exudate.  Eyes:     Pupils: Pupils are  equal, round, and reactive to light.  Neck:     Musculoskeletal: Normal range of motion.  Cardiovascular:     Rate and Rhythm: Regular rhythm.     Heart sounds: Normal heart sounds.  Pulmonary:     Effort: Pulmonary effort is normal. No respiratory distress.     Breath sounds: Normal breath sounds.     Comments: Lungs are clear to auscultation.  No wheezing, rhonchi, rales. Chest:     Chest wall: No tenderness.     Comments: No tenderness on palpation. Abdominal:     General: Bowel sounds are normal. There is no distension.     Palpations: Abdomen is soft.     Tenderness: There is no abdominal tenderness.     Comments: No abdominal tenderness but does have tenderness around sternum region, reports "feels sore from coughing.  Musculoskeletal:        General: No tenderness or deformity.     Right lower leg: No edema.     Left lower leg: No edema.  Skin:    General: Skin is warm and dry.  Neurological:     Mental Status: She is alert and oriented to person, place, and time.      ED Treatments / Results  Labs (all labs ordered are listed, but only abnormal results are displayed) Labs Reviewed  COMPREHENSIVE METABOLIC PANEL - Abnormal; Notable for the following components:      Result Value   Glucose, Bld 114 (*)    Creatinine, Ser 1.87 (*)    Calcium 8.7 (*)    GFR calc non Af Amer 30 (*)    GFR calc Af Amer 35 (*)    All other components within normal limits  URINALYSIS, ROUTINE W REFLEX MICROSCOPIC - Abnormal; Notable for the following components:   APPearance HAZY (*)    All other components within normal limits  CBC WITH DIFFERENTIAL/PLATELET  BRAIN NATRIURETIC PEPTIDE    EKG None  Radiology Dg Chest 2 View  Result Date: 10/13/2018 CLINICAL DATA:  Productive cough EXAM: CHEST - 2 VIEW COMPARISON:  05/11/2017 FINDINGS: Stable mild cardiomegaly. Nonaneurysmal tortuous aorta. Mild bilateral increase in interstitial lung markings may reflect a component of acute  bronchitic change. No alveolar consolidation. No overt pulmonary edema. No pneumothorax. The visualized skeletal structures are unremarkable. IMPRESSION: Suspect mild bronchitic change characterized by increased interstitial lung markings bilaterally. Stable mild cardiomegaly. Electronically Signed   By: Tollie Eth M.D.   On: 10/13/2018 22:29    Procedures Procedures (including critical care time)  Medications Ordered in ED Medications - No data to display   Initial Impression / Assessment and  Plan / ED Course  I have reviewed the triage vital signs and the nursing notes.  Pertinent labs & imaging results that were available during my care of the patient were reviewed by me and considered in my medical decision making (see chart for details).       With a previous history of CHF chronic systolic.  Presents to the ED with a chief complaint of cough since 2 days ago.  She reports pain along her chest and sternum region from coughing along with shortness of breath at baseline.  During evaluation patient is well-appearing, is fixated on television unable to provide much detail on her history.  CMP showed creatinine slightly elevated at 1.87 compared to previous which was 1.02, CBC showed no leukocytosis, hemoglobin is within normal limits.  Patient does not look volume overloaded.  BNP was obtained which was normal.  UA was negative for any nitrites, leukocytes, she denies any urinary symptoms at this time. During my initial evaluation patient seemed to be have soft pressures, she was given 500 mL bolus, has now improved with a pressure of 111/72 satting at 94% on room air at baseline compared to her previous visits. Chest x-ray showed: Suspect mild bronchitic change characterized by increased  interstitial lung markings bilaterally. Stable mild cardiomegaly.     Patient informed of results, with stable vital signs will be discharged for PCP follow-up.  Patient or stands and agrees with  management.  Return precautions provided at length.  Final Clinical Impressions(s) / ED Diagnoses   Final diagnoses:  Cough    ED Discharge Orders    None       Claude Manges, Cordelia Poche 10/14/18 1037    Gerhard Munch, MD 10/17/18 2125

## 2018-10-14 NOTE — Discharge Instructions (Addendum)
Your chest x-ray today was clear and it showed no pneumonia.  Your laboratory results were within normal limit aside from your kidney function which was elevated today at 1.87, please follow-up with your primary care physician to have this level rechecked.  If you experience any chest pain, worsening symptoms and return to the ED for reevaluation.

## 2018-11-28 ENCOUNTER — Telehealth: Payer: Self-pay | Admitting: Cardiovascular Disease

## 2018-11-28 NOTE — Telephone Encounter (Signed)
Called patient's pharmacy and informed them that it was fine to separate Hyzaar to losartan 100 mg by mouth daily and HCTZ 12.5 mg by mouth daily.

## 2018-11-28 NOTE — Telephone Encounter (Signed)
New Message   Leanne from [place] is calling and   said she needs authorization to separate the 2 drugs Losartan-Hydrchlorothiazide     Please call

## 2019-03-28 ENCOUNTER — Encounter: Payer: Self-pay | Admitting: Internal Medicine

## 2019-03-28 ENCOUNTER — Other Ambulatory Visit: Payer: Self-pay

## 2019-03-28 ENCOUNTER — Ambulatory Visit: Payer: 59 | Admitting: Internal Medicine

## 2019-03-28 VITALS — BP 140/70 | HR 98 | Temp 98.7°F | Wt 225.3 lb

## 2019-03-28 DIAGNOSIS — H6122 Impacted cerumen, left ear: Secondary | ICD-10-CM

## 2019-03-28 DIAGNOSIS — N179 Acute kidney failure, unspecified: Secondary | ICD-10-CM

## 2019-03-28 LAB — BASIC METABOLIC PANEL
BUN: 14 mg/dL (ref 6–23)
CO2: 29 mEq/L (ref 19–32)
Calcium: 9.3 mg/dL (ref 8.4–10.5)
Chloride: 103 mEq/L (ref 96–112)
Creatinine, Ser: 0.79 mg/dL (ref 0.40–1.20)
GFR: 91.74 mL/min (ref >60.00)
Glucose, Bld: 145 mg/dL — ABNORMAL HIGH (ref 70–99)
Potassium: 3.8 mEq/L (ref 3.5–5.1)
Sodium: 140 mEq/L (ref 135–145)

## 2019-03-28 NOTE — Patient Instructions (Signed)
-  Nice seeing you today!!  -Lab work today; will notify you once results are available.  -STOP taking ibuprofen, losartan and hydrochlorothiazide (HCTZ).  -Follow up with PCP in 2 weeks.

## 2019-03-28 NOTE — Progress Notes (Signed)
Acute Office Visit     CC/Reason for Visit: Something is wrong with my kidneys. My left ear feels full  HPI: Teresa [name] is a 53 y.o. female who is coming in today for the above mentioned reasons. Here for 2 acute complaints:  1. Was told something was wrong with her kidneys after an ED visit in March. Never followed up. Cr was 1.87 then. She has been on losartan/HCTZ and TID ibuprofen.  2. Left ear fullness, ringing for a week. Tried OTC drops without relief. NO pain or fever. No other URI symptoms.  Past Medical/Surgical History: Past Medical History:  Diagnosis Date  . Abnormal EKG   . CHF (congestive heart failure) (HCC)   . Dyspnea   . Elevated troponin   . HTN (hypertension)   . Leg edema   . Tobacco abuse     No past surgical history on file.  Social History:  reports that she has been smoking. She has been smoking about 0.25 packs per day. She has never used smokeless tobacco. She reports current alcohol use. She reports that she does not use drugs.  Allergies: No Known Allergies  Family History:  Family History  Problem Relation Age of Onset  . CAD Mother   . Diabetes Mother      Current Outpatient Medications:  .  gabapentin (NEURONTIN) 100 MG capsule, Take 1 capsule (100 mg total) by mouth 3 (three) times daily., Disp: 90 capsule, Rfl: 3 .  hydrALAZINE (APRESOLINE) 50 MG tablet, Take 1 tablet (50 mg total) by mouth 3 (three) times daily., Disp: 90 tablet, Rfl: 11 .  metoprolol succinate (TOPROL-XL) 25 MG 24 hr tablet, Take 1 tablet (25 mg total) by mouth daily., Disp: 30 tablet, Rfl: 11 .  omeprazole (PRILOSEC) 20 MG capsule, Take 1 capsule (20 mg total) daily by mouth., Disp: 30 capsule, Rfl: 1  Review of Systems:  Constitutional: Denies fever, chills, diaphoresis, appetite change and fatigue.  HEENT: Denies photophobia, eye pain, redness,  ear pain, congestion, sore throat, rhinorrhea, sneezing, mouth sores, trouble swallowing, neck pain,  neck stiffness and tinnitus.   Respiratory: Denies SOB, DOE, cough, chest tightness,  and wheezing.   Cardiovascular: Denies chest pain, palpitations and leg swelling.  Gastrointestinal: Denies nausea, vomiting, abdominal pain, diarrhea, constipation, blood in stool and abdominal distention.  Genitourinary: Denies dysuria, urgency, frequency, hematuria, flank pain and difficulty urinating.  Endocrine: Denies: hot or cold intolerance, sweats, changes in hair or nails, polyuria, polydipsia. Musculoskeletal: Denies myalgias, back pain, joint swelling, arthralgias and gait problem.  Skin: Denies pallor, rash and wound.  Neurological: Denies dizziness, seizures, syncope, weakness, light-headedness, numbness and headaches.  Hematological: Denies adenopathy. Easy bruising, personal or family bleeding history  Psychiatric/Behavioral: Denies suicidal ideation, mood changes, confusion, nervousness, sleep disturbance and agitation    Physical Exam: Vitals:   03/28/19 1258  BP: 140/70  Pulse: 98  Temp: 98.7 F (37.1 C)  TempSrc: Temporal  SpO2: 97%  Weight: 225 lb 4.8 oz (102.2 kg)    Body mass index is 36.36 kg/m.   Constitutional: NAD, calm, comfortable Eyes: PERRL, lids and conjunctivae normal ENMT: Mucous membranes are moist.  Tympanic membrane is pearly white, no erythema or bulging on the right, left is obstructed by cerumen. Neck: normal, supple, no masses, no thyromegaly Respiratory: clear to auscultation bilaterally, no wheezing, no crackles. Normal respiratory effort. No accessory muscle use.  Cardiovascular: Regular rate and rhythm, no murmurs / rubs / gallops. No extremity edema. 2+  pedal pulses. No carotid bruits.  Abdomen: no tenderness, no masses palpated. No hepatosplenomegaly. Bowel sounds positive.  Musculoskeletal: no clubbing / cyanosis. No joint deformity upper and lower extremities. Good ROM, no contractures. Normal muscle tone.  Skin: no rashes, lesions, ulcers. No  induration Neurologic: grossly intact and nonfocal Psychiatric: Normal judgment and insight. Alert and oriented x 3. Normal mood.    Impression and Plan:  Acute renal failure, unspecified acute renal failure type ([place])  -Last known Cr was 1.87 in March. -She was not advised to DC nephrotoxic agents, was instead asked to follow up with PCP, which she didn't. -Have advised discontinuation of ibuprofen, HCTZ, losartan. -Check BMET today for renal function. -Advised increased PO fluid intake. -Follow up with PCP in 2 weeks. -If Cr same or higher, will need further work up such as renal US to r/o obstruction. -May also need alternative BP meds.  Left ear impacted cerumen -Cerumen Desimpaction  Warm water was applied and gentle ear lavage performed on left ears. There were no complications and following the desimpaction the tympanic membranes were visible. Tympanic membranes are intact following the procedure. Auditory canals are normal. The patient reported relief of symptoms after removal of cerumen.     Patient Instructions  -Nice seeing you today!!  -Lab work today; will notify you once results are available.  -STOP taking ibuprofen, losartan and hydrochlorothiazide (HCTZ).  -Follow up with PCP in 2 weeks.     Lelon Frohlich, MD [facility]

## 2019-03-28 NOTE — Addendum Note (Signed)
Addended by: Diona Foley on: 03/28/2019 01:32 PM   Modules accepted: Orders

## 2019-11-22 ENCOUNTER — Other Ambulatory Visit: Payer: Self-pay | Admitting: Cardiovascular Disease

## 2019-12-03 ENCOUNTER — Other Ambulatory Visit: Payer: Self-pay | Admitting: Internal Medicine

## 2019-12-03 DIAGNOSIS — Z1231 Encounter for screening mammogram for malignant neoplasm of breast: Secondary | ICD-10-CM

## 2019-12-26 ENCOUNTER — Ambulatory Visit (HOSPITAL_COMMUNITY)
Admission: EM | Admit: 2019-12-26 | Discharge: 2019-12-26 | Disposition: A | Discharge status: Home / Self Care | Payer: 59 | Attending: Internal Medicine | Admitting: Internal Medicine

## 2019-12-26 ENCOUNTER — Encounter (HOSPITAL_COMMUNITY): Payer: Self-pay

## 2019-12-26 ENCOUNTER — Ambulatory Visit (INDEPENDENT_AMBULATORY_CARE_PROVIDER_SITE_OTHER): Payer: 59

## 2019-12-26 DIAGNOSIS — S63502A Unspecified sprain of left wrist, initial encounter: Secondary | ICD-10-CM

## 2019-12-26 DIAGNOSIS — M79642 Pain in left hand: Secondary | ICD-10-CM | POA: Diagnosis not present

## 2019-12-26 MED ORDER — IBUPROFEN 600 MG PO TABS: 600.0000 mg | ORAL_TABLET | Freq: Four times a day (QID) | ORAL | 0 refills | Status: DC | PRN

## 2019-12-26 NOTE — ED Triage Notes (Signed)
Pt states smashed her left hand on a cage and support beam 2 days ago. Pt c/o 5/10 sharp pain in left hand. Pt has 2+ edema of left hand, 2+ left radial pulse, cap refill less than 3 sec, pt unable to make a fist.

## 2019-12-26 NOTE — ED Provider Notes (Signed)
[facility]    CSN: [id number] Arrival date & time: 12/26/19  7829      History   Chief Complaint Chief Complaint  Patient presents with  . Hand Injury    HPI Teresa [name] is a 54 y.o. female comes to the urgent care with left wrist pain after she smashed her left hand between the cage and the support beam.  This happened 2 days ago.  She rates the pain at 5 out of 10.  Pain is constant, sharp with no known relieving factors.  Is aggravated by palpation and movement.  There is associated swelling of the left wrist.  No redness.  No fever or chills.  HPI  Past Medical History:  Diagnosis Date  . Abnormal EKG   . CHF (congestive heart failure) (HCC)   . Dyspnea   . Elevated troponin   . HTN (hypertension)   . Leg edema   . Tobacco abuse     Patient Active Problem List   Diagnosis Date Noted  . Hypertension 05/11/2017  . Orthopnea 05/11/2017  . Leg edema 05/11/2017  . Anemia 05/11/2017  . Elevated troponin   . Dyspnea   . Abnormal EKG   . Tobacco abuse     History reviewed. No pertinent surgical history.  OB History   No obstetric history on file.      Home Medications    Prior to Admission medications   Medication Sig Start Date End Date Taking? Authorizing Provider  gabapentin (NEURONTIN) 100 MG capsule Take 1 capsule (100 mg total) by mouth 3 (three) times daily. 01/10/18   [name], MD  hydrALAZINE (APRESOLINE) 50 MG tablet Take 1 tablet (50 mg total) by mouth 3 (three) times daily. 08/07/18   Wendall Stade, MD  hydrochlorothiazide (HYDRODIURIL) 12.5 MG tablet TAKE 1 TABLET BY MOUTH EVERY DAY 11/22/19   Wendall Stade, MD  ibuprofen (ADVIL) 600 MG tablet Take 1 tablet (600 mg total) by mouth every 6 (six) hours as needed. 12/26/19   Merrilee Jansky, MD  losartan (COZAAR) 100 MG tablet TAKE 1 TABLET BY MOUTH EVERY DAY 11/22/19   Wendall Stade, MD  metoprolol succinate (TOPROL-XL) 25 MG 24 hr tablet Take 1 tablet (25 mg total) by  mouth daily. 08/07/18   Wendall Stade, MD  omeprazole (PRILOSEC) 20 MG capsule Take 1 capsule (20 mg total) daily by mouth. 06/22/17   Elson Areas, PA-C    Family History Family History  Problem Relation Age of Onset  . CAD Mother   . Diabetes Mother     Social History Social History   Tobacco Use  . Smoking status: Current Every Day Smoker    Packs/day: 0.25  . Smokeless tobacco: Never Used  Substance Use Topics  . Alcohol use: Yes    Comment: occ beer  . Drug use: No     Allergies   Patient has no known allergies.   Review of Systems Review of Systems  Constitutional: Negative for activity change, fatigue and fever.  HENT: Negative.   Cardiovascular: Negative.   Gastrointestinal: Negative.   Musculoskeletal: Positive for arthralgias and joint swelling. Negative for myalgias, neck pain and neck stiffness.  Skin: Negative for color change, pallor and wound.     Physical Exam Triage Vital Signs ED Triage Vitals  Enc Vitals Group     BP 12/26/19 1019 (!) 181/89     Pulse Rate 12/26/19 1019 65     Resp  12/26/19 1019 16     Temp 12/26/19 1019 98.3 F (36.8 C)     Temp Source 12/26/19 1019 Oral     SpO2 12/26/19 1019 100 %     Weight 12/26/19 1023 240 lb (108.9 kg)     Height 12/26/19 1023 5\' 5"  (1.651 m)     Head Circumference --      Peak Flow --      Pain Score 12/26/19 1023 5     Pain Loc --      Pain Edu? --      Excl. in GC? --    No data found.  Updated Vital Signs BP (!) 181/89   Pulse 65   Temp 98.3 F (36.8 C) (Oral)   Resp 16   Ht 5\' 5"  (1.651 m)   Wt 108.9 kg   SpO2 100%   BMI 39.94 kg/m   Visual Acuity Right Eye Distance:   Left Eye Distance:   Bilateral Distance:    Right Eye Near:   Left Eye Near:    Bilateral Near:     Physical Exam Vitals and nursing note reviewed.  Constitutional:      General: She is not in acute distress.    Appearance: Normal appearance. She is not ill-appearing.  Musculoskeletal:         General: Swelling, tenderness, deformity and signs of injury present.     Comments: Range of motion is limited by pain.  Skin:    General: Skin is warm.     Capillary Refill: Capillary refill takes less than 2 seconds.  Neurological:     General: No focal deficit present.     Mental Status: She is alert.     Motor: No weakness.     Coordination: Coordination normal.      UC Treatments / Results  Labs (all labs ordered are listed, but only abnormal results are displayed) Labs Reviewed - No data to display  EKG   Radiology DG Hand Complete Left  Result Date: 12/26/2019 CLINICAL DATA:  Acute left hand pain after injury 2 days ago. EXAM: LEFT HAND - COMPLETE 3+ VIEW COMPARISON:  None. FINDINGS: There is no evidence of fracture or dislocation. There is no evidence of arthropathy or other focal bone abnormality. Soft tissues are unremarkable. IMPRESSION: Negative. Electronically Signed   By: M.D.   On: 12/26/2019 11:07    Procedures Procedures (including critical care time)  Medications Ordered in UC Medications - No data to display  Initial Impression / Assessment and Plan / UC Course  I have reviewed the triage vital signs and the nursing notes.  Pertinent labs & imaging results that were available during my care of the patient were reviewed by me and considered in my medical decision making (see chart for details).     1.  Sprain of the left wrist: X-ray of the left wrist is negative for any fractures Left wrist brace Ibuprofen 600 mg every 6 hours as needed for pain Return precautions given Rest Icing of left wrist Gentle range of motion exercises Elevation Final Clinical Impressions(s) / UC Diagnoses   Final diagnoses:  Sprain of left wrist, initial encounter   Discharge Instructions   None    ED Prescriptions    Medication Sig Dispense Auth. Provider   ibuprofen (ADVIL) 600 MG tablet Take 1 tablet (600 mg total) by mouth every 6 (six) hours  as needed. 30 tablet Vala Raffo, Lupita Raider, MD  PDMP not reviewed this encounter.   Chase Picket, MD 12/26/19 [phone]

## 2020-08-26 NOTE — Progress Notes (Signed)
Cardiology Office Note   Date:  09/04/2020   ID:  BIVIANA [name], DOB 1966-02-23, MRN [id number]  PCP:  Teresa Saint, MD  Cardiologist:   Teresa Haws, MD   No chief complaint on file.     History of Present Illness:  55 y.o. first seen in consult 05/11/17.  History of HTN and smoking. Non compliant with meds   Echo 05/12/17 reviewed with EF 25-30% diffuse hypokinesis mild MR Trying to quit smoking on zyban Still having one during her breaks at [facility]. Weight is down   Does not want to try entresto worried about expense Still drinking beer   When last seen in 2019 had not been on meds for a month and still drinking/smoking Seen in ED 10/14/18 with Cr 1.87 NSAI's, HCTZ, losartan d/c F/U Cr 03/28/19 normal   She continues to not take meds or care for herself Her husband Teresa [name] died in 06/11/2023 From COVID They have a 70 yo child She has been vaccinated but he never was    Past Medical History:  Diagnosis Date  . Abnormal EKG   . CHF (congestive heart failure) (HCC)   . Dyspnea   . Elevated troponin   . HTN (hypertension)   . Leg edema   . Tobacco abuse     History reviewed. No pertinent surgical history.   Current Outpatient Medications  Medication Sig Dispense Refill  . gabapentin (NEURONTIN) 100 MG capsule Take 1 capsule (100 mg total) by mouth 3 (three) times daily. (Patient not taking: Reported on 09/04/2020) 90 capsule 3  . hydrALAZINE (APRESOLINE) 50 MG tablet Take 1 tablet (50 mg total) by mouth 3 (three) times daily. (Patient not taking: Reported on 09/04/2020) 90 tablet 11  . hydrochlorothiazide (HYDRODIURIL) 12.5 MG tablet TAKE 1 TABLET BY MOUTH EVERY DAY (Patient not taking: Reported on 09/04/2020) 30 tablet 0  . ibuprofen (ADVIL) 600 MG tablet Take 1 tablet (600 mg total) by mouth every 6 (six) hours as needed. (Patient not taking: Reported on 09/04/2020) 30 tablet 0  . losartan (COZAAR) 100 MG tablet TAKE 1 TABLET BY MOUTH EVERY DAY (Patient not taking:  Reported on 09/04/2020) 30 tablet 0  . metoprolol succinate (TOPROL-XL) 25 MG 24 hr tablet Take 1 tablet (25 mg total) by mouth daily. (Patient not taking: Reported on 09/04/2020) 30 tablet 11  . omeprazole (PRILOSEC) 20 MG capsule Take 1 capsule (20 mg total) daily by mouth. (Patient not taking: Reported on 09/04/2020) 30 capsule 1   No current facility-administered medications for this visit.    Allergies:   Patient has no known allergies.    Social History:  The patient  reports that she has been smoking. She has been smoking about 0.25 packs per day. She has never used smokeless tobacco. She reports current alcohol use. She reports that she does not use drugs.   Family History:  The patient's family history includes CAD in her mother; Diabetes in her mother.    ROS:  Please see the history of present illness.   Otherwise, review of systems are positive for none.   All other systems are reviewed and negative.    PHYSICAL EXAM: VS:  BP (!) 180/110   Pulse 80   Ht 5\' 5"  (1.651 m)   Wt 98 kg   SpO2 98%   BMI 35.94 kg/m  , BMI Body mass index is 35.94 kg/m.   Affect appropriate Overweight female  HEENT: normal Neck supple with no adenopathy JVP  normal no bruits no thyromegaly Lungs clear with no wheezing and good diaphragmatic motion Heart:  S1/S2 no murmur, no rub, gallop or click PMI enlarged  Abdomen: benighn, BS positve, no tenderness, no AAA no bruit.  No HSM or HJR Distal pulses intact with no bruits No edema Neuro non-focal Skin warm and dry No muscular weakness  EKG:   09/04/2020 NSR rate 80 PaC;s    Recent Labs: No results found for requested labs within last 8760 hours.    Lipid Panel    Component Value Date/Time   CHOL 140 05/11/2017 1752   TRIG 120 05/11/2017 1752   HDL 56 05/11/2017 1752   CHOLHDL 2.5 05/11/2017 1752   VLDL 24 05/11/2017 1752   LDLCALC 60 05/11/2017 1752      Wt Readings from Last 3 Encounters:  09/04/20 98 kg  12/26/19 108.9  kg  03/28/19 102.2 kg      Other studies Reviewed: Additional studies/ records that were reviewed today include: [name] office notes October. Hospital admissionl labs ECG, CXR and echo .  Echo 05/12/17 Study Conclusions  - Left ventricle: The cavity size was moderately dilated. There was   moderate concentric hypertrophy. Systolic function was severely   reduced. The estimated ejection fraction was in the range of 25%   to 30%. Severe diffuse hypokinesis with no identifiable regional   variations. Features are consistent with a pseudonormal left   ventricular filling pattern, with concomitant abnormal relaxation   and increased filling pressure (grade 2 diastolic dysfunction).   Doppler parameters are consistent with high ventricular filling   pressure. - Aortic valve: Valve area (VTI): 2.57 cm^2. Valve area (Vmax): 2.7   cm^2. Valve area (Vmean): 2.49 cm^2. - Mitral valve: There was mild regurgitation. - Left atrium: The atrium was mildly dilated. - Pulmonic valve: There was trivial regurgitation. - Pericardium, extracardiac: A trivial, free-flowing pericardial   effusion was identified along the right atrial free wall. The   fluid had no internal echoes. There was mildright atrial chamber   collapse for less than 50% of the cardiac cycle.   ASSESSMENT AND PLAN:  1.  CHF: surprisingly asymptomatic despite non compliance and poor habits continued TTE 05/12/17 EF 25-30% See below will wait till BP better and on medication before repeating echo  To stress importance of med compliance, d/c ETOH and low sodium diet Update TTE for EF  2. HTN  Poorly controlled due to ETOH and non compliance discussed Will restart beta blocker and hydralazine Will need to see what BMET looks like before deciding on diuretic and ARB  [name] will be cost prohibitive  3. Smoking on zyban cutting back doing better  4. CRF:  Labs today   F/U  4-6 weeks   Teresa [name]

## 2020-09-04 ENCOUNTER — Encounter (INDEPENDENT_AMBULATORY_CARE_PROVIDER_SITE_OTHER): Payer: Self-pay

## 2020-09-04 ENCOUNTER — Other Ambulatory Visit: Payer: Self-pay

## 2020-09-04 ENCOUNTER — Encounter: Payer: Self-pay | Admitting: Cardiovascular Disease

## 2020-09-04 ENCOUNTER — Ambulatory Visit (INDEPENDENT_AMBULATORY_CARE_PROVIDER_SITE_OTHER): Payer: PRIVATE HEALTH INSURANCE | Admitting: Cardiovascular Disease

## 2020-09-04 VITALS — BP 180/110 | HR 80 | Ht 65.0 in | Wt 216.0 lb

## 2020-09-04 DIAGNOSIS — R0600 Dyspnea, unspecified: Secondary | ICD-10-CM | POA: Diagnosis not present

## 2020-09-04 DIAGNOSIS — I10 Essential (primary) hypertension: Secondary | ICD-10-CM | POA: Diagnosis not present

## 2020-09-04 DIAGNOSIS — I509 Heart failure, unspecified: Secondary | ICD-10-CM | POA: Diagnosis not present

## 2020-09-04 LAB — BASIC METABOLIC PANEL
CO2: 25 mmol/L (ref 20–29)
Glucose: 97 mg/dL (ref 65–99)
Sodium: 140 mmol/L (ref 134–144)

## 2020-09-04 LAB — CBC WITH DIFFERENTIAL/PLATELET
Basophils Absolute: 0 10*3/uL (ref 0.0–0.2)
Hematocrit: 37.9 % (ref 34.0–46.6)
Hemoglobin: 12.1 g/dL (ref 11.1–15.9)
Immature Granulocytes: 0 %
Monocytes: 9 %

## 2020-09-04 LAB — PRO B NATRIURETIC PEPTIDE

## 2020-09-04 MED ORDER — HYDRALAZINE HCL 50 MG PO TABS: 50.0000 mg | ORAL_TABLET | Freq: Three times a day (TID) | ORAL | 3 refills | Status: DC

## 2020-09-04 MED ORDER — METOPROLOL SUCCINATE ER 25 MG PO TB24: 25.0000 mg | ORAL_TABLET | Freq: Every day | ORAL | 3 refills | Status: DC

## 2020-09-04 NOTE — Patient Instructions (Signed)
Medication Instructions:  Your physician has recommended you make the following change in your medication:  1-START Metoprolol 25 mg by mouth daily. 2-START Hydralazine 50 mg by mouth 3 times daily.  *If you need a refill on your cardiac medications before your next appointment, please call your pharmacy*   Lab Work: Your physician recommends that you have lab work today. BMET, BNP, and CBC  If you have labs (blood work) drawn today and your tests are completely normal, you will receive your results only by: Marland Kitchen MyChart Message (if you have MyChart) OR . A paper copy in the mail If you have any lab test that is abnormal or we need to change your treatment, we will call you to review the results.  Follow-Up: At [facility], you and your health needs are our priority.  As part of our continuing mission to provide you with exceptional heart care, we have created designated Provider Care Teams.  These Care Teams include your primary Cardiologist (physician) and Advanced Practice Providers (APPs -  Physician Assistants and Nurse Practitioners) who all work together to provide you with the care you need, when you need it.  We recommend signing up for the patient portal called "MyChart".  Sign up information is provided on this After Visit Summary.  MyChart is used to connect with patients for Virtual Visits (Telemedicine).  Patients are able to view lab/test results, encounter notes, upcoming appointments, etc.  Non-urgent messages can be sent to your provider as well.   To learn more about what you can do with MyChart, go to [email or web address].    Your next appointment:   6 week(s)  The format for your next appointment:   In Person  Provider:   You may see Charlton Haws, MD or one of the following Advanced Practice Providers on your designated Care Team:    Georgie Chard, NP

## 2020-09-05 LAB — CBC WITH DIFFERENTIAL/PLATELET
Basos: 1 %
EOS (ABSOLUTE): 0.1 10*3/uL (ref 0.0–0.4)
Eos: 2 %
Immature Grans (Abs): 0 10*3/uL (ref 0.0–0.1)
Lymphocytes Absolute: 2 10*3/uL (ref 0.7–3.1)
Lymphs: 37 %
MCH: 28.1 pg (ref 26.6–33.0)
MCHC: 31.9 g/dL (ref 31.5–35.7)
MCV: 88 fL (ref 79–97)
Monocytes Absolute: 0.5 10*3/uL (ref 0.1–0.9)
Neutrophils Absolute: 2.8 10*3/uL (ref 1.4–7.0)
Neutrophils: 51 %
Platelets: 231 10*3/uL (ref 150–450)
RBC: 4.31 x10E6/uL (ref 3.77–5.28)
RDW: 13.6 % (ref 11.7–15.4)
WBC: 5.4 10*3/uL (ref 3.4–10.8)

## 2020-09-05 LAB — BASIC METABOLIC PANEL
BUN/Creatinine Ratio: 18 (ref 9–23)
BUN: 12 mg/dL (ref 6–24)
Calcium: 9.2 mg/dL (ref 8.7–10.2)
Chloride: 102 mmol/L (ref 96–106)
Creatinine, Ser: 0.68 mg/dL (ref 0.57–1.00)
GFR calc Af Amer: 114 mL/min/{1.73_m2} (ref >59)
GFR calc non Af Amer: 99 mL/min/{1.73_m2} (ref >59)
Potassium: 4.1 mmol/L (ref 3.5–5.2)

## 2020-10-21 NOTE — Progress Notes (Signed)
Cardiology Office Note   Date:  10/22/2020   ID:  Teresa [name], DOB [birth year], MRN [id number]  PCP:  [name], MD  Cardiologist: Dr. Charlton Haws, MD  Chief Complaint  Patient presents with  . Follow-up     History of Present Illness: Teresa [name] is a 55 y.o. female who presents for follow-up, seen for Dr. Loleta Dicker.  Teresa [name] has a history of HTN, tobacco use, chronic systolic CHF with last LVEF 05/2017 at 25 to 30%, and CKD stage III   Echocardiogram 05/12/2017 with LVEF at 25 to 30% with diffuse hypokinesis and mild MR.  She has had issues with medical noncompliance in the past.  Unclear if she ever underwent ischemic evaluation for her cardiomyopathy.  She was most recently seen by Dr. Eden Emms 09/04/20 at which time her BP was uncontrolled felt to be due to ETOH and non-compliance. Plan at that time was to assess renal function with BMET and attempt to add diuretis and ARB with close follow up.   She is here today and states that she is doing well from a CV standpoint.  BP is much improved today at 158/80.  Lab work after last OV is stable with a creatinine at 0.68 and a potassium at 4.1.  Given this, we discussed plan to add ARB and spironolactone with close follow-up.  Past Medical History:  Diagnosis Date  . Abnormal EKG   . CHF (congestive heart failure) (HCC)   . Dyspnea   . Elevated troponin   . HTN (hypertension)   . Leg edema   . Tobacco abuse     History reviewed. No pertinent surgical history.   Current Outpatient Medications  Medication Sig Dispense Refill  . hydrALAZINE (APRESOLINE) 50 MG tablet Take 1 tablet (50 mg total) by mouth 3 (three) times daily. 270 tablet 3  . ibuprofen (ADVIL) 600 MG tablet Take 1 tablet (600 mg total) by mouth every 6 (six) hours as needed. 30 tablet 0  . losartan (COZAAR) 25 MG tablet Take 1 tablet (25 mg total) by mouth daily. 90 tablet 3  . metoprolol succinate (TOPROL-XL) 25 MG 24 hr tablet Take 1 tablet  (25 mg total) by mouth daily. 90 tablet 3  . spironolactone (ALDACTONE) 25 MG tablet Take 0.5 tablets (12.5 mg total) by mouth daily. 90 tablet 3   No current facility-administered medications for this visit.    Allergies:   Patient has no known allergies.    Social History:  The patient  reports that she has been smoking. She has been smoking about 0.25 packs per day. She has never used smokeless tobacco. She reports current alcohol use. She reports that she does not use drugs.   Family History:  The patient's family history includes CAD in her mother; Diabetes in her mother.    ROS:  Please see the history of present illness.   Otherwise, review of systems are positive for none. All other systems are reviewed and negative.    PHYSICAL EXAM: VS:  BP (!) 158/80   Pulse 74   Ht 5\' 5"  (1.651 m)   Wt 219 lb 3.2 oz (99.4 kg)   SpO2 96%   BMI 36.48 kg/m  , BMI Body mass index is 36.48 kg/m.   General: Well developed, well nourished, NAD Neck: Negative for carotid bruits. No JVD Lungs:Clear to ausculation bilaterally. No wheezes, rales, or rhonchi. Breathing is unlabored. Cardiovascular: RRR with S1 S2. No murmurs Extremities: No  edema. Neuro: Alert and oriented. No focal deficits. No facial asymmetry. MAE spontaneously. Psych: Responds to questions appropriately with normal affect.    EKG:  EKG is not ordered today.  Recent Labs: 09/04/2020: BUN 12; Creatinine, Ser 0.68; Hemoglobin 12.1; NT-Pro BNP 217; Platelets 231; Potassium 4.1; Sodium 140    Lipid Panel    Component Value Date/Time   CHOL 140 05/11/2017 1752   TRIG 120 05/11/2017 1752   HDL 56 05/11/2017 1752   CHOLHDL 2.5 05/11/2017 1752   VLDL 24 05/11/2017 1752   LDLCALC 60 05/11/2017 1752      Wt Readings from Last 3 Encounters:  10/22/20 219 lb 3.2 oz (99.4 kg)  09/04/20 216 lb (98 kg)  12/26/19 240 lb (108.9 kg)     Other studies Reviewed: Additional studies/ records that were reviewed today include:  . Review of the above records demonstrates:   Echo 05/12/17 Study Conclusions  - Left ventricle: The cavity size was moderately dilated. There was moderate concentric hypertrophy. Systolic function was severely reduced. The estimated ejection fraction was in the range of 25% to 30%. Severe diffuse hypokinesis with no identifiable regional variations. Features are consistent with a pseudonormal left ventricular filling pattern, with concomitant abnormal relaxation and increased filling pressure (grade 2 diastolic dysfunction). Doppler parameters are consistent with high ventricular filling pressure. - Aortic valve: Valve area (VTI): 2.57 cm^2. Valve area (Vmax): 2.7 cm^2. Valve area (Vmean): 2.49 cm^2. - Mitral valve: There was mild regurgitation. - Left atrium: The atrium was mildly dilated. - Pulmonic valve: There was trivial regurgitation. - Pericardium, extracardiac: A trivial, free-flowing pericardial effusion was identified along the right atrial free wall. The fluid had no internal echoes. There was mildright atrial chamber collapse for less than 50% of the cardiac cycle.  ASSESSMENT AND PLAN:  1. HTN: -Last seen with Dr. Eden Emms and noted to be hypertensive  -BP improved today at 158/80 however not yet at goal -Plan was for BMET and add diuretic and ARB>> add losartan 25 mg daily, spironolactone 12.5 mg daily -Lab work in 1 to 2 weeks, follow-up 4 to 6 weeks -Last creatinine, 0.68 on 09/04/2020  2. Chronic systolic CHF/cardiomyopathy:  -Echo from 05/2017 with LVEF at 25-30% -Repeat echocardiogram after more stable BP control and on GDMT -Add losartan 25 mg daily, spironolactone 12.5 mg daily -Follow with BMET in 1 to 2 weeks and office visit in 4 to 6 weeks for possible up titration  3.  Tobacco use: -Cessation strongly encouraged  4.  CKD: -Stable, creatinine at 0.68 -Recheck labs in 1 to 2 weeks  Current medicines are reviewed at length  with the patient today.  The patient does not have concerns regarding medicines.  The following changes have been made: Add losartan 25 mg daily, spironolactone 12.5 mg daily  Labs/ tests ordered today include: BMET  Orders Placed This Encounter  Procedures  . Basic metabolic panel     Disposition:   FU with myself in 6 weeks  Signed, Georgie Chard, NP  10/22/2020 9:50 AM    [facility] [address] [place], [place], [place]  [address] Phone: [phone]; Fax: [phone]

## 2020-10-22 ENCOUNTER — Other Ambulatory Visit: Payer: Self-pay

## 2020-10-22 ENCOUNTER — Encounter: Payer: Self-pay | Admitting: Cardiology

## 2020-10-22 ENCOUNTER — Ambulatory Visit: Payer: PRIVATE HEALTH INSURANCE | Admitting: Cardiology

## 2020-10-22 VITALS — BP 158/80 | HR 74 | Ht 65.0 in | Wt 219.2 lb

## 2020-10-22 DIAGNOSIS — I5022 Chronic systolic (congestive) heart failure: Secondary | ICD-10-CM

## 2020-10-22 DIAGNOSIS — R600 Localized edema: Secondary | ICD-10-CM | POA: Diagnosis not present

## 2020-10-22 DIAGNOSIS — I509 Heart failure, unspecified: Secondary | ICD-10-CM | POA: Diagnosis not present

## 2020-10-22 DIAGNOSIS — I10 Essential (primary) hypertension: Secondary | ICD-10-CM

## 2020-10-22 DIAGNOSIS — Z720 Tobacco use: Secondary | ICD-10-CM

## 2020-10-22 MED ORDER — SPIRONOLACTONE 25 MG PO TABS: 12.5000 mg | ORAL_TABLET | Freq: Every day | ORAL | 3 refills | Status: DC

## 2020-10-22 MED ORDER — LOSARTAN POTASSIUM 25 MG PO TABS: 25.0000 mg | ORAL_TABLET | Freq: Every day | ORAL | 3 refills | Status: DC

## 2020-10-22 NOTE — Patient Instructions (Addendum)
Medication Instructions:  Your physician has recommended you make the following change in your medication:  1. START LOSARTAN 25 MG DAILY  2. START SPIRONOLACTONE 12.5 MG DAILY.  *If you need a refill on your cardiac medications before your next appointment, please call your pharmacy*   Lab Work: TO BE DONE 1-2 WEEKS: BMET If you have labs (blood work) drawn today and your tests are completely normal, you will receive your results only by: Marland Kitchen MyChart Message (if you have MyChart) OR . A paper copy in the mail If you have any lab test that is abnormal or we need to change your treatment, we will call you to review the results.   Testing/Procedures: NONE   Follow-Up: At [facility], you and your health needs are our priority.  As part of our continuing mission to provide you with exceptional heart care, we have created designated Provider Care Teams.  These Care Teams include your primary Cardiologist (physician) and Advanced Practice Providers (APPs -  Physician Assistants and Nurse Practitioners) who all work together to provide you with the care you need, when you need it.  We recommend signing up for the patient portal called "MyChart".  Sign up information is provided on this After Visit Summary.  MyChart is used to connect with patients for Virtual Visits (Telemedicine).  Patients are able to view lab/test results, encounter notes, upcoming appointments, etc.  Non-urgent messages can be sent to your provider as well.   To learn more about what you can do with MyChart, go to [email or web address].    Your next appointment:   6-8 week(s)  The format for your next appointment:   In Person  Provider:   You may see Charlton Haws, MD or one of the following Advanced Practice Providers on your designated Care Team:    Georgie Chard, NP

## 2020-10-29 ENCOUNTER — Other Ambulatory Visit: Payer: PRIVATE HEALTH INSURANCE

## 2020-10-29 ENCOUNTER — Other Ambulatory Visit: Payer: Self-pay

## 2020-10-29 DIAGNOSIS — R600 Localized edema: Secondary | ICD-10-CM

## 2020-10-29 DIAGNOSIS — I509 Heart failure, unspecified: Secondary | ICD-10-CM

## 2020-10-29 DIAGNOSIS — I10 Essential (primary) hypertension: Secondary | ICD-10-CM

## 2020-10-29 LAB — BASIC METABOLIC PANEL
BUN/Creatinine Ratio: 14 (ref 9–23)
BUN: 10 mg/dL (ref 6–24)
CO2: 24 mmol/L (ref 20–29)
Calcium: 9.5 mg/dL (ref 8.7–10.2)
Chloride: 101 mmol/L (ref 96–106)
Creatinine, Ser: 0.72 mg/dL (ref 0.57–1.00)
Glucose: 101 mg/dL — ABNORMAL HIGH (ref 65–99)
Potassium: 4.6 mmol/L (ref 3.5–5.2)
Sodium: 139 mmol/L (ref 134–144)
eGFR: 99 mL/min/{1.73_m2} (ref >59)

## 2020-12-27 NOTE — Progress Notes (Signed)
Cardiology Office Note   Date:  12/30/2020   ID:  Teresa [name], DOB [birth year], MRN [id number]  PCP:  [name], MD  Cardiologist:  Dr. Eden Emms, MD  Chief Complaint  Patient presents with  . Follow-up    History of Present Illness: Teresa [name] is a 55 y.o. female who presents for for follow-up, seen for Dr. Eden Emms.  [name] has a history of HTN, tobacco use, chronic systolic CHF with last LVEF 05/2017 at 25 to 30%, and CKD stage III   Echocardiogram 05/12/2017 with LVEF at 25 to 30% with diffuse hypokinesis and mild MR.  She has had issues with medical noncompliance in the past. Unclear if she ever underwent ischemic evaluation for her cardiomyopathy.  She was most recently seen by Dr. Eden Emms 09/04/20 at which time her BP was uncontrolled felt to be due to ETOH and non-compliance. Plan at that time was to assess renal function with BMET and attempt to add diuretis and ARB with close follow up.   I then saw her in follow up at which time she was doing well from a CV standpoint. BP was improved. Lab work was stable with a creatinine at 0.68>>0.72 and a potassium at 4.1>>4.6. Given this, we discussed plan to add ARB and spironolactone with close follow-up.  Today she reports she has been doing well although she reports that she has not been consistently taking her medications over the last several weeks. She has started smoking again, mainly out of stress from loosing her husband to COVID last year. She states she feel that she is on too many medicines. We discussed adding Entresto and taking off losartan and hydralazine. She aggress this will help with her compliance. She denies chest pain, SOB, palpitations, LE edema or syncope.   Past Medical History:  Diagnosis Date  . Abnormal EKG   . CHF (congestive heart failure) (HCC)   . Dyspnea   . Elevated troponin   . HTN (hypertension)   . Leg edema   . Tobacco abuse     History reviewed. No pertinent surgical  history.   Current Outpatient Medications  Medication Sig Dispense Refill  . hydrALAZINE (APRESOLINE) 50 MG tablet Take 1 tablet (50 mg total) by mouth 3 (three) times daily. 270 tablet 3  . losartan (COZAAR) 25 MG tablet Take 1 tablet (25 mg total) by mouth daily. 90 tablet 3  . metoprolol succinate (TOPROL-XL) 25 MG 24 hr tablet Take 1 tablet (25 mg total) by mouth daily. 90 tablet 3  . sacubitril-valsartan (ENTRESTO) 24-26 MG Take 1 tablet by mouth 2 (two) times daily. 30 tablet 0  . spironolactone (ALDACTONE) 25 MG tablet Patient take 0.5 tablet by mouth one day then 1 whole tablet (25 mg total) the next day     No current facility-administered medications for this visit.    Allergies:   Patient has no known allergies.    Social History:  The patient  reports that she has been smoking. She has been smoking about 0.25 packs per day. She has never used smokeless tobacco. She reports current alcohol use. She reports that she does not use drugs.   Family History:  The patient's family history includes CAD in her mother; Diabetes in her mother.    ROS:  Please see the history of present illness. Otherwise, review of systems are positive for none.   All other systems are reviewed and negative.    PHYSICAL EXAM: VS:  BP (!) 158/50   Pulse 90   Ht 5\' 6"  (1.676 m)   Wt 220 lb 6.4 oz (100 kg)   SpO2 97%   BMI 35.57 kg/m  , BMI Body mass index is 35.57 kg/m.   General: Well developed, well nourished, NAD Lungs:Clear to ausculation bilaterally. Breathing is unlabored. Cardiovascular: RRR with S1 S2. No murmurs Extremities: No edema Neuro: Alert and oriented. No focal deficits. No facial asymmetry. MAE spontaneously. Psych: Responds to questions appropriately with normal affect.     EKG:  EKG is not ordered today.  Recent Labs: 09/04/2020: Hemoglobin 12.1; NT-Pro BNP 217; Platelets 231 10/29/2020: BUN 10; Creatinine, Ser 0.72; Potassium 4.6; Sodium 139   Lipid Panel     Component Value Date/Time   CHOL 140 05/11/2017 1752   TRIG 120 05/11/2017 1752   HDL 56 05/11/2017 1752   CHOLHDL 2.5 05/11/2017 1752   VLDL 24 05/11/2017 1752   LDLCALC 60 05/11/2017 1752     Wt Readings from Last 3 Encounters:  12/30/20 220 lb 6.4 oz (100 kg)  10/22/20 219 lb 3.2 oz (99.4 kg)  09/04/20 216 lb (98 kg)    Other studies Reviewed: Additional studies/ records that were reviewed today include:  Review of the above records demonstrates:   Echo 05/12/17 Study Conclusions  - Left ventricle: The cavity size was moderately dilated. There was moderate concentric hypertrophy. Systolic function was severely reduced. The estimated ejection fraction was in the range of 25% to 30%. Severe diffuse hypokinesis with no identifiable regional variations. Features are consistent with a pseudonormal left ventricular filling pattern, with concomitant abnormal relaxation and increased filling pressure (grade 2 diastolic dysfunction). Doppler parameters are consistent with high ventricular filling pressure. - Aortic valve: Valve area (VTI): 2.57 cm^2. Valve area (Vmax): 2.7 cm^2. Valve area (Vmean): 2.49 cm^2. - Mitral valve: There was mild regurgitation. - Left atrium: The atrium was mildly dilated. - Pulmonic valve: There was trivial regurgitation. - Pericardium, extracardiac: A trivial, free-flowing pericardial effusion was identified along the right atrial free wall. The fluid had no internal echoes. There was mildright atrial chamber collapse for less than 50% of the cardiac cycle.  ASSESSMENT AND PLAN:  1. HTN: -Elevated however she has not taken her medications today  -Will stop hydralazine and losartan and start Entresto 24/26 dosing, BMET today and in 1 week. If stable, she can increase to 49/51 dosing  -Increase spiro to 25mg  QD  -Creatinine, 0.72 on last labs from 10/29/2020 -Plan for echo prior to next follow up  2. Chronic systolic  CHF/cardiomyopathy:  -Echo from 05/2017 with LVEF at 25-30% -Plan to repeat echo in one month -BMET today  -Start Enstresto 24/26>> up to 49/51 if BMET and BP stable in 1-2 weeks  3.  Tobacco use: -Cessation strongly encouraged   Current medicines are reviewed at length with the patient today.  The patient does not have concerns regarding medicines.  The following changes have been made:  Add Entresto 24/26PO BID   Labs/ tests ordered today include: BMET today and in one week   Orders Placed This Encounter  Procedures  . Basic metabolic panel  . Basic metabolic panel  . ECHOCARDIOGRAM COMPLETE    Disposition:   FU with Dr. 10/31/2020  in 3 months  Signed, 06/2017, NP  12/30/2020 4:26 PM    [facility] [address] [place], [place], [place]  [place] Phone: [phone]; Fax: [phone]

## 2020-12-30 ENCOUNTER — Ambulatory Visit: Payer: PRIVATE HEALTH INSURANCE | Admitting: Cardiology

## 2020-12-30 ENCOUNTER — Other Ambulatory Visit: Payer: Self-pay

## 2020-12-30 ENCOUNTER — Encounter: Payer: Self-pay | Admitting: Cardiology

## 2020-12-30 VITALS — BP 158/50 | HR 90 | Ht 66.0 in | Wt 220.4 lb

## 2020-12-30 DIAGNOSIS — I509 Heart failure, unspecified: Secondary | ICD-10-CM

## 2020-12-30 DIAGNOSIS — I428 Other cardiomyopathies: Secondary | ICD-10-CM | POA: Diagnosis not present

## 2020-12-30 DIAGNOSIS — I10 Essential (primary) hypertension: Secondary | ICD-10-CM

## 2020-12-30 DIAGNOSIS — I5022 Chronic systolic (congestive) heart failure: Secondary | ICD-10-CM | POA: Diagnosis not present

## 2020-12-30 DIAGNOSIS — Z720 Tobacco use: Secondary | ICD-10-CM | POA: Diagnosis not present

## 2020-12-30 MED ORDER — ENTRESTO 24-26 MG PO TABS: 1.0000 | ORAL_TABLET | Freq: Two times a day (BID) | ORAL | 0 refills | Status: DC

## 2020-12-30 MED ORDER — SPIRONOLACTONE 25 MG PO TABS: 25.0000 mg | ORAL_TABLET | Freq: Every day | ORAL | 3 refills | Status: DC

## 2020-12-30 NOTE — Addendum Note (Signed)
Addended by: Michaelle Copas on: 12/30/2020 04:38 PM   Modules accepted: Orders

## 2020-12-30 NOTE — Patient Instructions (Addendum)
Medication Instructions:  Your physician has recommended you make the following change in your medication:  1. START ENTRESTO 24-26 TWICE DAILY FOR 14 DAYS.  2.  INCREASE SPIRONOLACTONE TO 25 MG DAILY.  Please Honor Card patient is presenting for Teresa [name]: [id number]; Teresa [name]: [id number]; RXPCN: [id number]; RXID: [id number]   *If you need a refill on your cardiac medications before your next appointment, please call your pharmacy*   Lab Work: TODAY: BMET  TO BE DONE IN 2 WEEKS: BMET  If you have labs (blood work) drawn today and your tests are completely normal, you will receive your results only by: Teresa [name] MyChart Message (if you have MyChart) OR . A paper copy in the mail If you have any lab test that is abnormal or we need to change your treatment, we will call you to review the results.   Testing/Procedures: Your physician has requested that you have an echocardiogram. Echocardiography is a painless test that uses sound waves to create images of your heart. It provides your doctor with information about the size and shape of your heart and how well your heart's chambers and valves are working. This procedure takes approximately one hour. There are no restrictions for this procedure.     Follow-Up: At [facility], you and your health needs are our priority.  As part of our continuing mission to provide you with exceptional heart care, we have created designated Provider Care Teams.  These Care Teams include your primary Cardiologist (physician) and Advanced Practice Providers (APPs -  Physician Assistants and Nurse Practitioners) who all work together to provide you with the care you need, when you need it.  We recommend signing up for the patient portal called "MyChart".  Sign up information is provided on this After Visit Summary.  MyChart is used to connect with patients for Virtual Visits (Telemedicine).  Patients are able to view lab/test results, encounter notes, upcoming  appointments, etc.  Non-urgent messages can be sent to your provider as well.   To learn more about what you can do with MyChart, go to [email or web address].    Your next appointment:   2-3 month(s)  The format for your next appointment:   In Person  Provider:   You may see Charlton Haws, MD or one of the following Advanced Practice Providers on your designated Care Team:    Georgie Chard, NP    Other Instructions  Echocardiogram An echocardiogram is a test that uses sound waves (ultrasound) to produce images of the heart. Images from an echocardiogram can provide important information about:  Heart size and shape.  The size and thickness and movement of your heart's walls.  Heart muscle function and strength.  Heart valve function or if you have stenosis. Stenosis is when the heart valves are too narrow.  If blood is flowing backward through the heart valves (regurgitation).  A tumor or infectious growth around the heart valves.  Areas of heart muscle that are not working well because of poor blood flow or injury from a heart attack.  Aneurysm detection. An aneurysm is a weak or damaged part of an artery wall. The wall bulges out from the normal force of blood pumping through the body. Tell a health care provider about:  Any allergies you have.  All medicines you are taking, including vitamins, herbs, eye drops, creams, and over-the-counter medicines.  Any blood disorders you have.  Any surgeries you have had.  Any medical conditions you have.  Whether you are pregnant or  may be pregnant. What are the risks? Generally, this is a safe test. However, problems may occur, including an allergic reaction to dye (contrast) that may be used during the test. What happens before the test? No specific preparation is needed. You may eat and drink normally. What happens during the test?  You will take off your clothes from the waist up and put on a hospital  gown.  Electrodes or electrocardiogram (ECG)patches may be placed on your chest. The electrodes or patches are then connected to a device that monitors your heart rate and rhythm.  You will lie down on a table for an ultrasound exam. A gel will be applied to your chest to help sound waves pass through your skin.  A handheld device, called a transducer, will be pressed against your chest and moved over your heart. The transducer produces sound waves that travel to your heart and bounce back (or "echo" back) to the transducer. These sound waves will be captured in real-time and changed into images of your heart that can be viewed on a video monitor. The images will be recorded on a computer and reviewed by your health care provider.  You may be asked to change positions or hold your breath for a short time. This makes it easier to get different views or better views of your heart.  In some cases, you may receive contrast through an IV in one of your veins. This can improve the quality of the pictures from your heart. The procedure may vary among health care providers and hospitals.   What can I expect after the test? You may return to your normal, everyday life, including diet, activities, and medicines, unless your health care provider tells you not to do that. Follow these instructions at home:  It is up to you to get the results of your test. Ask your health care provider, or the department that is doing the test, when your results will be ready.  Keep all follow-up visits. This is important. Summary  An echocardiogram is a test that uses sound waves (ultrasound) to produce images of the heart.  Images from an echocardiogram can provide important information about the size and shape of your heart, heart muscle function, heart valve function, and other possible heart problems.  You do not need to do anything to prepare before this test. You may eat and drink normally.  After the  echocardiogram is completed, you may return to your normal, everyday life, unless your health care provider tells you not to do that. This information is not intended to replace advice given to you by your health care provider. Make sure you discuss any questions you have with your health care provider. Document Revised: 03/17/2020 Document Reviewed: 03/17/2020 Elsevier Patient Education  2021 [employer].

## 2020-12-31 ENCOUNTER — Telehealth: Payer: Self-pay | Admitting: Cardiovascular Disease

## 2020-12-31 LAB — BASIC METABOLIC PANEL
BUN/Creatinine Ratio: 15 (ref 9–23)
BUN: 12 mg/dL (ref 6–24)
CO2: 26 mmol/L (ref 20–29)
Calcium: 8.9 mg/dL (ref 8.7–10.2)
Chloride: 104 mmol/L (ref 96–106)
Creatinine, Ser: 0.79 mg/dL (ref 0.57–1.00)
Glucose: 116 mg/dL — ABNORMAL HIGH (ref 65–99)
Potassium: 3.9 mmol/L (ref 3.5–5.2)
Sodium: 141 mmol/L (ref 134–144)
eGFR: 88 mL/min/{1.73_m2} (ref >59)

## 2020-12-31 NOTE — Telephone Encounter (Signed)
Per note from yesterday, patient should stop losartan and hydralazine and begin Entresto 24-26 BID

## 2020-12-31 NOTE — Telephone Encounter (Signed)
Patient was calling in for clarification if she still needs to continue taking the hydrALAZINE (APRESOLINE) 50 MG tablet and losartan (COZAAR) 25 MG tablet being that she is going to start taking sacubitril-valsartan (ENTRESTO) 24-26 MG now. Please advise

## 2020-12-31 NOTE — Telephone Encounter (Signed)
Called patient to inform her to stop taking hydralazine and losartan.  She verbalizes understanding. Medications removed from medication list.

## 2021-01-13 ENCOUNTER — Other Ambulatory Visit: Payer: PRIVATE HEALTH INSURANCE | Admitting: *Deleted

## 2021-01-13 ENCOUNTER — Other Ambulatory Visit: Payer: Self-pay

## 2021-01-13 DIAGNOSIS — I5022 Chronic systolic (congestive) heart failure: Secondary | ICD-10-CM

## 2021-01-13 DIAGNOSIS — I428 Other cardiomyopathies: Secondary | ICD-10-CM

## 2021-01-13 DIAGNOSIS — I509 Heart failure, unspecified: Secondary | ICD-10-CM

## 2021-01-13 DIAGNOSIS — I10 Essential (primary) hypertension: Secondary | ICD-10-CM

## 2021-01-13 LAB — BASIC METABOLIC PANEL
BUN/Creatinine Ratio: 17 (ref 9–23)
BUN: 12 mg/dL (ref 6–24)
CO2: 25 mmol/L (ref 20–29)
Calcium: 9.2 mg/dL (ref 8.7–10.2)
Chloride: 100 mmol/L (ref 96–106)
Creatinine, Ser: 0.7 mg/dL (ref 0.57–1.00)
Glucose: 103 mg/dL — ABNORMAL HIGH (ref 65–99)
Potassium: 4.1 mmol/L (ref 3.5–5.2)
Sodium: 139 mmol/L (ref 134–144)
eGFR: 102 mL/min/{1.73_m2} (ref >59)

## 2021-01-26 ENCOUNTER — Emergency Department (HOSPITAL_COMMUNITY): Payer: BC Managed Care – PPO

## 2021-01-26 ENCOUNTER — Emergency Department (HOSPITAL_COMMUNITY)
Admission: EM | Admit: 2021-01-26 | Discharge: 2021-01-26 | Disposition: A | Discharge status: Home / Self Care | Payer: BC Managed Care – PPO | Attending: Emergency Medicine | Admitting: Emergency Medicine

## 2021-01-26 DIAGNOSIS — I509 Heart failure, unspecified: Secondary | ICD-10-CM | POA: Insufficient documentation

## 2021-01-26 DIAGNOSIS — Z79899 Other long term (current) drug therapy: Secondary | ICD-10-CM | POA: Diagnosis not present

## 2021-01-26 DIAGNOSIS — I110 Hypertensive heart disease with heart failure: Secondary | ICD-10-CM | POA: Diagnosis not present

## 2021-01-26 DIAGNOSIS — M25572 Pain in left ankle and joints of left foot: Secondary | ICD-10-CM | POA: Diagnosis not present

## 2021-01-26 DIAGNOSIS — M7989 Other specified soft tissue disorders: Secondary | ICD-10-CM | POA: Diagnosis not present

## 2021-01-26 DIAGNOSIS — F17210 Nicotine dependence, cigarettes, uncomplicated: Secondary | ICD-10-CM | POA: Diagnosis not present

## 2021-01-26 MED ORDER — MELOXICAM 7.5 MG PO TABS: 7.5000 mg | ORAL_TABLET | Freq: Two times a day (BID) | ORAL | 0 refills | Status: AC | PRN

## 2021-01-26 NOTE — Discharge Instructions (Addendum)
Your x-ray today shows no signs of broken bones, you do have arthritis of your ankle, please stop taking ibuprofen and instead take Mobic twice a day.  You should take this with food.  You will need to have your family doctor follow-up with you within the week if no better, you may also benefit from seeing the orthopedic surgeon who specializes in this joint.  I have given you the phone number for the orthopedic office, call for next available appointment.

## 2021-01-26 NOTE — ED Triage Notes (Signed)
Pt reports intermittent L ankle swelling, worse since last night. Denies injury. Endorses pain when walking. Denies recent travel, hx of dvt, or hormonal birth control.

## 2021-01-26 NOTE — ED Provider Notes (Signed)
[facility] EMERGENCY DEPARTMENT Provider Note   CSN: [id number] Arrival date & time: 01/26/21  1008     History Chief Complaint  Patient presents with   Foot Swelling    Teresa [name] is a 55 y.o. female.  HPI  This patient is a 55 year old female who has a known history of congestive heart failure, she also has a history of some swelling of her legs and is followed closely by her cardiologist.  She is currently taking medications including ibuprofen for her ankle discomfort as well as Entresto, Aldactone and Toprol.  The patient reports having a history of bilateral ankle pain that has been going on for some time.  She states that there are days that are worse than others however starting last night she had increasing pain in her left ankle, this pain is much worse when she tries to stand and walk on it and states that when she tried to stand up she almost fell.  She has been walking with crutches this morning.  She denies associated trauma injury or fevers.  She has not had any recent surgery.  She has not seen an orthopedist for this.  Past Medical History:  Diagnosis Date   Abnormal EKG    CHF (congestive heart failure) (HCC)    Dyspnea    Elevated troponin    HTN (hypertension)    Leg edema    Tobacco abuse     Patient Active Problem List   Diagnosis Date Noted   Hypertension 05/11/2017   Orthopnea 05/11/2017   Leg edema 05/11/2017   Anemia 05/11/2017   Elevated troponin    Dyspnea    Abnormal EKG    Tobacco abuse     No past surgical history on file.   OB History   No obstetric history on file.     Family History  Problem Relation Age of Onset   CAD Mother    Diabetes Mother     Social History   Tobacco Use   Smoking status: Every Day    Packs/day: 0.25    Pack years: 0.00    Types: Cigarettes   Smokeless tobacco: Never  Vaping Use   Vaping Use: Never used  Substance Use Topics   Alcohol use: Yes    Comment: occ beer    Drug use: No    Home Medications Prior to Admission medications   Medication Sig Start Date End Date Taking? Authorizing Provider  meloxicam (MOBIC) 7.5 MG tablet Take 1 tablet (7.5 mg total) by mouth 2 (two) times daily as needed for up to 14 days for pain. 01/26/21 02/09/21 Yes Eber Hong, MD  metoprolol succinate (TOPROL-XL) 25 MG 24 hr tablet Take 1 tablet (25 mg total) by mouth daily. 09/04/20   Wendall Stade, MD  sacubitril-valsartan (ENTRESTO) 24-26 MG Take 1 tablet by mouth 2 (two) times daily. 12/30/20   Georgie Chard D, NP  spironolactone (ALDACTONE) 25 MG tablet Take 1 tablet (25 mg total) by mouth daily. 12/30/20 03/30/21  Filbert Schilder, NP    Allergies    Patient has no known allergies.  Review of Systems   Review of Systems  Constitutional:  Negative for fever.  Musculoskeletal:  Positive for arthralgias.   Physical Exam Updated Vital Signs BP (!) 152/103 (BP Location: Right Arm)   Pulse 70   Temp 98.4 F (36.9 C) (Oral)   Resp 16   SpO2 100%   Physical Exam Vitals and nursing note reviewed.  Constitutional:      Appearance: She is well-developed. She is not diaphoretic.  HENT:     Head: Normocephalic and atraumatic.  Eyes:     General:        Right eye: No discharge.        Left eye: No discharge.     Conjunctiva/sclera: Conjunctivae normal.  Pulmonary:     Effort: Pulmonary effort is normal. No respiratory distress.  Musculoskeletal:        General: Swelling present. No tenderness or deformity.     Right lower leg: No edema.     Left lower leg: No edema.     Comments: The left ankle has a small amount of edema, the ankle joint is extremely supple, there is no tenderness with palpation over the medial or lateral malleolus, no tenderness over the base of the fifth metatarsal or the midfoot.  She can fully range her ankle to flexion extension as well as eversion and inversion and there is no pain with any of this.  When she tries to stand on it she does  have discomfort.  There is no redness or warmth to this joint or any other joint  Skin:    General: Skin is warm and dry.     Findings: No erythema or rash.  Neurological:     Mental Status: She is alert.     Coordination: Coordination normal.    ED Results / Procedures / Treatments   Labs (all labs ordered are listed, but only abnormal results are displayed) Labs Reviewed - No data to display  EKG None  Radiology DG Ankle Complete Left  Result Date: 01/26/2021 CLINICAL DATA:  Ankle swelling.  No known injury. EXAM: LEFT ANKLE COMPLETE - 3+ VIEW COMPARISON:  No recent. FINDINGS: Soft tissue swelling. Diffuse degenerative change. Tiny corticated bony density noted adjacent to the medial malleolus, most likely secondary to old fracture fragment. Calcaneal spurring. No acute abnormality identified. No radiopaque foreign body. IMPRESSION: Diffuse osteopenia degenerative change. Tiny corticated bony density noted adjacent to the medial malleolus, most likely old fracture fragment. No acute bony abnormality identified. Electronically Signed   By: Maisie Fus  Register   On: 01/26/2021 11:08    Procedures Procedures   Medications Ordered in ED Medications - No data to display  ED Course  I have reviewed the triage vital signs and the nursing notes.  Pertinent labs & imaging results that were available during my care of the patient were reviewed by me and considered in my medical decision making (see chart for details).    MDM Rules/Calculators/A&P                          Imaging reviews that the patient has some arthritis and degenerative changes, there is possibly an old fracture fragment however the patient denies injury and has totally normal range of motion without difficulty.  At this time I will change the patient's anti-inflammatory to Mobic and have her stop taking ibuprofen, she can continue crutches, ice packs and follow-up with her family doctor.  I will also refer her to  orthopedics  Pt agreeable  Final Clinical Impression(s) / ED Diagnoses Final diagnoses:  Left ankle pain, unspecified chronicity    Rx / DC Orders ED Discharge Orders          Ordered    meloxicam (MOBIC) 7.5 MG tablet  2 times daily PRN        01/26/21 1339  Eber Hong, MD 01/26/21 1343

## 2021-01-26 NOTE — ED Provider Notes (Signed)
Emergency Medicine Provider Triage Evaluation Note  BREAUNNA GOTTLIEB 55 y.o. female was evaluated in triage.  Pt complains of left ankle swelling has been ongoing for the last few days.  She states no preceding trauma, injury, fall.  Feeling it is gotten worse.  She reports when she ambulates bears weight on it, it hurts more.  No overlying warmth, erythema.  No numbness/weakness.  No fevers, chills. She denies any OCP use, recent immobilization, prior history of DVT/PE, recent surgery, leg swelling, or long travel.    Review of Systems  Positive: Ankle swelling Negative: Fevers, chills, warmth, erythema, numbness/weakness.  Physical Exam  BP 134/82   Pulse 70   Temp 98.2 F (36.8 C) (Oral)   Resp 18   Ht 5\' 4"  (1.626 m)   Wt 65.8 kg   SpO2 100%   BMI 24.89 kg/m  Gen:   Awake, no distress   HEENT:  Atraumatic  Resp:  Normal effort  Cardiac:  Normal rate. 2+ DP pulse noted to LLE Abd:   Nondistended, nontender  MSK:   Moves extremities without difficulty.  Mild soft tissue swelling noted around the left ankle.  No overlying warmth, erythema. Neuro:  Speech clear  Other:     Medical Decision Making  Medically screening exam initiated at 10:17 AM  Appropriate orders placed.  BERDENE ASKARI was informed that the remainder of the evaluation will be completed by another provider, this initial triage assessment does not replace that evaluation. They are counseled that they will need to remain in the ED until the completion of their workup, including full H&P and results of any tests.  Risks of leaving the emergency department prior to completion of treatment were discussed. Patient was advised to inform ED staff if they are leaving before their treatment is complete. The patient acknowledged these risks and time was allowed for questions.     The patient appears stable so that the remainder of the MSE may be completed by another provider.   Clinical Impression  Ankle  swelling   Portions of this note were generated with Dragon dictation software. Dictation errors may occur despite best attempts at proofreading.     Dallie Piles, PA-C 01/26/21 1021    01/28/21, MD 01/26/21 1335

## 2021-01-27 ENCOUNTER — Other Ambulatory Visit: Payer: Self-pay | Admitting: Cardiology

## 2021-01-29 ENCOUNTER — Encounter (HOSPITAL_COMMUNITY): Payer: Self-pay | Admitting: Cardiology

## 2021-01-29 ENCOUNTER — Other Ambulatory Visit (HOSPITAL_COMMUNITY): Payer: PRIVATE HEALTH INSURANCE

## 2021-02-05 DIAGNOSIS — M25571 Pain in right ankle and joints of right foot: Secondary | ICD-10-CM | POA: Diagnosis not present

## 2021-02-19 ENCOUNTER — Ambulatory Visit (HOSPITAL_COMMUNITY): Payer: BC Managed Care – PPO | Attending: Cardiovascular Disease

## 2021-02-19 ENCOUNTER — Other Ambulatory Visit: Payer: Self-pay

## 2021-02-19 DIAGNOSIS — I428 Other cardiomyopathies: Secondary | ICD-10-CM

## 2021-02-19 LAB — ECHOCARDIOGRAM COMPLETE
Area-P 1/2: 4.49 cm2
Calc EF: 50.3 %
S' Lateral: 3.4 cm
Single Plane A2C EF: 51.9 %
Single Plane A4C EF: 51.4 %

## 2021-02-26 DIAGNOSIS — M25572 Pain in left ankle and joints of left foot: Secondary | ICD-10-CM | POA: Diagnosis not present

## 2021-03-26 NOTE — Progress Notes (Deleted)
Cardiology Office Note   Date:  03/26/2021   ID:  ECE CUMBERLAND, DOB [birth year], MRN [id number]  PCP:  [name], MD  Cardiologist: Dr. Charlton Haws, MD  No chief complaint on file.    History of Present Illness: Teresa [name] is a 55 y.o. female who presents for follow-up, history of HTN, tobacco use, chronic systolic CHF with  LVEF 05/2017 at 25 to 30%, and CKD stage III BP hard to control due to ETOH and non compliance Updated echo 02/19/21 EF 50-55% now on Entresto.  She continues to smoke  ***.  Past Medical History:  Diagnosis Date   Abnormal EKG    CHF (congestive heart failure) (HCC)    Dyspnea    Elevated troponin    HTN (hypertension)    Leg edema    Tobacco abuse     No past surgical history on file.   Current Outpatient Medications  Medication Sig Dispense Refill   ENTRESTO 24-26 MG TAKE 1 TABLET BY MOUTH TWICE DAILY 60 tablet 11   metoprolol succinate (TOPROL-XL) 25 MG 24 hr tablet Take 1 tablet (25 mg total) by mouth daily. 90 tablet 3   spironolactone (ALDACTONE) 25 MG tablet Take 1 tablet (25 mg total) by mouth daily. 90 tablet 3   No current facility-administered medications for this visit.    Allergies:   Patient has no known allergies.    Social History:  The patient  reports that she has been smoking. She has been smoking an average of .25 packs per day. She has never used smokeless tobacco. She reports current alcohol use. She reports that she does not use drugs.   Family History:  The patient's family history includes CAD in her mother; Diabetes in her mother.    ROS:  Please see the history of present illness.   Otherwise, review of systems are positive for none. All other systems are reviewed and negative.    PHYSICAL EXAM: VS:  There were no vitals taken for this visit. , BMI There is no height or weight on file to calculate BMI.   Affect appropriate Healthy:  appears stated age HEENT: normal Neck supple with no  adenopathy JVP normal no bruits no thyromegaly Lungs clear with no wheezing and good diaphragmatic motion Heart:  S1/S2 no murmur, no rub, gallop or click PMI normal Abdomen: benighn, BS positve, no tenderness, no AAA no bruit.  No HSM or HJR Distal pulses intact with no bruits No edema Neuro non-focal Skin warm and dry No muscular weakness   EKG:  ***  Recent Labs: 09/04/2020: Hemoglobin 12.1; NT-Pro BNP 217; Platelets 231 01/13/2021: BUN 12; Creatinine, Ser 0.70; Potassium 4.1; Sodium 139    Lipid Panel    Component Value Date/Time   CHOL 140 05/11/2017 1752   TRIG 120 05/11/2017 1752   HDL 56 05/11/2017 1752   CHOLHDL 2.5 05/11/2017 1752   VLDL 24 05/11/2017 1752   LDLCALC 60 05/11/2017 1752      Wt Readings from Last 3 Encounters:  12/30/20 100 kg  10/22/20 99.4 kg  09/04/20 98 kg     Other studies Reviewed: Additional studies/ records that were reviewed today include: . Review of the above records demonstrates:    Echo 02/19/21 IMPRESSIONS     1. Left ventricular ejection fraction, by estimation, is 50 to 55%. The  left ventricle has low normal function. The left ventricle has no regional  wall motion abnormalities. Left ventricular diastolic  parameters are  indeterminate.   2. Right ventricular systolic function is normal. The right ventricular  size is normal.   3. The mitral valve is normal in structure. Mild mitral valve  regurgitation.   4. The aortic valve is normal in structure. Aortic valve regurgitation is  not visualized. No aortic stenosis is present.   ASSESSMENT AND PLAN:  1. HTN: -Well controlled.  Continue current medications and low sodium Dash type diet.    2. Chronic systolic CHF/cardiomyopathy:  -Echo from 05/2017 with LVEF at 25-30% -Updated 02/19/21 50-55%  -Hydralazine and ARB d/c now on Entresto   3.  Tobacco use: -Cessation strongly encouraged  4.  CKD: -Stable, creatinine at 0.70 01/13/21 with K 4.1   Current medicines  are reviewed at length with the patient today.  The patient does not have concerns regarding medicines.  The following changes have been made: ***  Labs/ tests ordered today include: ***  No orders of the defined types were placed in this encounter.    Disposition:   FU in a year   Signed, Charlton Haws, MD  03/26/2021 1:00 PM    [facility] [address] [place], [place], [place]  [address] Phone: [phone]; Fax: [phone]

## 2021-03-31 ENCOUNTER — Ambulatory Visit: Payer: PRIVATE HEALTH INSURANCE | Admitting: Cardiovascular Disease

## 2021-09-10 ENCOUNTER — Other Ambulatory Visit: Payer: Self-pay | Admitting: Cardiovascular Disease

## 2021-09-10 NOTE — Telephone Encounter (Signed)
Pt's medication was sent to pt's pharmacy as requested. Confirmation received.  °

## 2021-10-21 ENCOUNTER — Encounter (HOSPITAL_COMMUNITY): Payer: Self-pay | Admitting: *Deleted

## 2021-10-21 ENCOUNTER — Ambulatory Visit (HOSPITAL_COMMUNITY)
Admission: EM | Admit: 2021-10-21 | Discharge: 2021-10-21 | Disposition: A | Discharge status: Home / Self Care | Payer: 59 | Attending: Student | Admitting: Student

## 2021-10-21 ENCOUNTER — Other Ambulatory Visit: Payer: Self-pay

## 2021-10-21 DIAGNOSIS — K219 Gastro-esophageal reflux disease without esophagitis: Secondary | ICD-10-CM | POA: Diagnosis not present

## 2021-10-21 MED ORDER — LANSOPRAZOLE 30 MG PO CPDR: 30.0000 mg | DELAYED_RELEASE_CAPSULE | Freq: Every day | ORAL | 0 refills | Status: DC

## 2021-10-21 MED ORDER — ONDANSETRON 8 MG PO TBDP: 8.0000 mg | ORAL_TABLET | Freq: Three times a day (TID) | ORAL | 0 refills | Status: DC | PRN

## 2021-10-21 MED ORDER — FAMOTIDINE 20 MG PO TABS: 20.0000 mg | ORAL_TABLET | Freq: Two times a day (BID) | ORAL | 1 refills | Status: DC

## 2021-10-21 NOTE — ED Provider Notes (Signed)
?[facility] ? ? ? ?CSN: [id number] ?Arrival date & time: 10/21/21  4580 ? ? ?  ? ?History   ?Chief Complaint ?Chief Complaint  ?Patient presents with  ? Emesis  ? Abdominal Pain  ? ? ?HPI ?Teresa [name] is a 56 y.o. female presenting with abdominal pain and vomiting for 1 month.  History of hypertension, CHF.  Distant history of GERD, on no chronic medications for this.  Describes 1 month of epigastric burning and gnawing following eating, associated with bilious vomiting.  Has attempted Alka-Seltzer and Tums with moderate relief.  Denies spicy food, excessive ibuprofen, alcohol; she does drink coffee regularly.  Never any hematemesis, melena, hematochezia.  No prior history of abdominal procedures. ? ?HPI ? ?Past Medical History:  ?Diagnosis Date  ? Abnormal EKG   ? CHF (congestive heart failure) (HCC)   ? Dyspnea   ? Elevated troponin   ? HTN (hypertension)   ? Leg edema   ? Tobacco abuse   ? ? ?Patient Active Problem List  ? Diagnosis Date Noted  ? Hypertension 05/11/2017  ? Orthopnea 05/11/2017  ? Leg edema 05/11/2017  ? Anemia 05/11/2017  ? Elevated troponin   ? Dyspnea   ? Abnormal EKG   ? Tobacco abuse   ? ? ?History reviewed. No pertinent surgical history. ? ?OB History   ?No obstetric history on file. ?  ? ? ? ?Home Medications   ? ?Prior to Admission medications   ?Medication Sig Start Date End Date Taking? Authorizing Provider  ?famotidine (PEPCID) 20 MG tablet Take 1 tablet (20 mg total) by mouth 2 (two) times daily. 10/21/21 11/20/21 Yes Rhys Martini, PA-C  ?lansoprazole (PREVACID) 30 MG capsule Take 1 capsule (30 mg total) by mouth daily at 12 noon. 10/21/21  Yes Rhys Martini, PA-C  ?ondansetron (ZOFRAN-ODT) 8 MG disintegrating tablet Take 1 tablet (8 mg total) by mouth every 8 (eight) hours as needed for nausea or vomiting. 10/21/21  Yes Rhys Martini, PA-C  ?ENTRESTO 24-26 MG TAKE 1 TABLET BY MOUTH TWICE DAILY 01/27/21   Georgie Chard D, NP  ?metoprolol succinate (TOPROL-XL) 25 MG 24  hr tablet Take 1 tablet (25 mg total) by mouth daily. Please make yearly appt with Dr. Eden Emms for May 2023 for future refills. Thank you 1st attempt 09/10/21   Wendall Stade, MD  ?spironolactone (ALDACTONE) 25 MG tablet Take 1 tablet (25 mg total) by mouth daily. 12/30/20 03/30/21  Filbert Schilder, NP  ? ? ?Family History ?Family History  ?Problem Relation Age of Onset  ? CAD Mother   ? Diabetes Mother   ? ? ?Social History ?Social History  ? ?Tobacco Use  ? Smoking status: Every Day  ?  Packs/day: 0.25  ?  Types: Cigarettes  ? Smokeless tobacco: Never  ?Vaping Use  ? Vaping Use: Never used  ?Substance Use Topics  ? Alcohol use: Yes  ?  Comment: occ beer  ? Drug use: No  ? ? ? ?Allergies   ?Patient has no known allergies. ? ? ?Review of Systems ?Review of Systems  ?Constitutional:  Negative for appetite change, chills and fever.  ?HENT:  Negative for congestion, ear pain, rhinorrhea, sinus pressure, sinus pain and sore throat.   ?Eyes:  Negative for redness and visual disturbance.  ?Respiratory:  Negative for cough, chest tightness, shortness of breath and wheezing.   ?Cardiovascular:  Negative for chest pain and palpitations.  ?Gastrointestinal:  Positive for abdominal pain. Negative for constipation,  diarrhea, nausea and vomiting.  ?Genitourinary:  Negative for dysuria, frequency and urgency.  ?Musculoskeletal:  Negative for myalgias.  ?Neurological:  Negative for dizziness, weakness and headaches.  ?Psychiatric/Behavioral:  Negative for confusion.   ?All other systems reviewed and are negative. ? ? ?Physical Exam ?Triage Vital Signs ?ED Triage Vitals  ?Enc Vitals Group  ?   BP 10/21/21 1001 (!) 143/80  ?   Pulse Rate 10/21/21 1001 68  ?   Resp 10/21/21 1001 18  ?   Temp 10/21/21 1001 98.3 ?F (36.8 ?C)  ?   Temp src --   ?   SpO2 10/21/21 1001 100 %  ?   Weight --   ?   Height --   ?   Head Circumference --   ?   Peak Flow --   ?   Pain Score 10/21/21 0958 0  ?   Pain Loc --   ?   Pain Edu? --   ?   Excl. in GC? --    ? ?No data found. ? ?Updated Vital Signs ?BP (!) 143/80   Pulse 68   Temp 98.3 ?F (36.8 ?C)   Resp 18   SpO2 100%  ? ?Visual Acuity ?Right Eye Distance:   ?Left Eye Distance:   ?Bilateral Distance:   ? ?Right Eye Near:   ?Left Eye Near:    ?Bilateral Near:    ? ?Physical Exam ?Vitals reviewed.  ?Constitutional:   ?   General: She is not in acute distress. ?   Appearance: Normal appearance. She is not ill-appearing.  ?HENT:  ?   Head: Normocephalic and atraumatic.  ?   Mouth/Throat:  ?   Mouth: Mucous membranes are moist.  ?   Comments: Moist mucous membranes ?Eyes:  ?   Extraocular Movements: Extraocular movements intact.  ?   Pupils: Pupils are equal, round, and reactive to light.  ?Cardiovascular:  ?   Rate and Rhythm: Normal rate and regular rhythm.  ?   Heart sounds: Normal heart sounds.  ?Pulmonary:  ?   Effort: Pulmonary effort is normal.  ?   Breath sounds: Normal breath sounds. No wheezing, rhonchi or rales.  ?Abdominal:  ?   General: Bowel sounds are normal. There is no distension.  ?   Palpations: Abdomen is soft. There is no mass.  ?   Tenderness: There is no abdominal tenderness. There is no right CVA tenderness, left CVA tenderness, guarding or rebound.  ?   Comments: No pain to palpation   ?Skin: ?   General: Skin is warm.  ?   Capillary Refill: Capillary refill takes less than 2 seconds.  ?   Comments: Good skin turgor  ?Neurological:  ?   General: No focal deficit present.  ?   Mental Status: She is alert and oriented to person, place, and time.  ?Psychiatric:     ?   Mood and Affect: Mood normal.     ?   Behavior: Behavior normal.  ? ? ? ?UC Treatments / Results  ?Labs ?(all labs ordered are listed, but only abnormal results are displayed) ?Labs Reviewed - No data to display ? ?EKG ? ? ?Radiology ?No results found. ? ?Procedures ?Procedures (including critical care time) ? ?Medications Ordered in UC ?Medications - No data to display ? ?Initial Impression / Assessment and Plan / UC Course  ?I  have reviewed the triage vital signs and the nursing notes. ? ?Pertinent labs & imaging results that were available  during my care of the patient were reviewed by me and considered in my medical decision making (see chart for details). ? ?  ? ?This patient is a very pleasant 56 y.o. year old female presenting with GERD. Reassuring exam - no pain to palpation, appears well hydrated. ? ?Trial of PPI, famotidine, zofran ODT prn. F/u with PCP for recheck and possible referral to GI.  ? ?Work note provided. ED return precautions discussed. Patient verbalizes understanding and agreement.  ?   ? ?Final Clinical Impressions(s) / UC Diagnoses  ? ?Final diagnoses:  ?Gastroesophageal reflux disease without esophagitis  ? ? ? ?Discharge Instructions   ? ?  ?-Lansoprazole once daily x30 days ?-Pepcid up to twice daily with meals  ?-Take the Zofran (ondansetron) up to 3 times daily for nausea and vomiting. ?-Limit acidic foods, spicy food, coffee, orange juice, ibuprofen ?-Follow-up with PCP if symptoms persist ?-Head to ED if severe abdominal pain, vomiting blood, blood in stool, etc.  ? ? ?ED Prescriptions   ? ? Medication Sig Dispense Auth. Provider  ? lansoprazole (PREVACID) 30 MG capsule Take 1 capsule (30 mg total) by mouth daily at 12 noon. 30 capsule Rhys Martini, PA-C  ? ondansetron (ZOFRAN-ODT) 8 MG disintegrating tablet Take 1 tablet (8 mg total) by mouth every 8 (eight) hours as needed for nausea or vomiting. 20 tablet Rhys Martini, PA-C  ? famotidine (PEPCID) 20 MG tablet Take 1 tablet (20 mg total) by mouth 2 (two) times daily. 60 tablet Rhys Martini, PA-C  ? ?  ? ?PDMP not reviewed this encounter. ?  ?Rhys Martini, PA-C ?10/21/21 1018 ? ?

## 2021-10-21 NOTE — Discharge Instructions (Addendum)
-  Lansoprazole once daily x30 days ?-Pepcid up to twice daily with meals  ?-Take the Zofran (ondansetron) up to 3 times daily for nausea and vomiting. ?-Limit acidic foods, spicy food, coffee, orange juice, ibuprofen ?-Follow-up with PCP if symptoms persist ?-Head to ED if severe abdominal pain, vomiting blood, blood in stool, etc.  ?

## 2021-10-21 NOTE — ED Triage Notes (Signed)
Pt reports for the last month what ever she eats it makes he nauseated and Pt also has ABD cramping. Pt reports she vomits hours after eating. Pt feels like she may have a lot of acid on her stomach. ?

## 2021-11-03 ENCOUNTER — Ambulatory Visit: Payer: 59 | Admitting: Family Medicine

## 2021-11-03 ENCOUNTER — Encounter: Payer: Self-pay | Admitting: Family Medicine

## 2021-11-03 VITALS — BP 182/82 | HR 73 | Temp 98.5°F | Ht 66.0 in | Wt 195.0 lb

## 2021-11-03 DIAGNOSIS — R1010 Upper abdominal pain, unspecified: Secondary | ICD-10-CM | POA: Diagnosis not present

## 2021-11-03 DIAGNOSIS — R634 Abnormal weight loss: Secondary | ICD-10-CM | POA: Diagnosis not present

## 2021-11-03 LAB — LIPASE: Lipase: 29 U/L (ref 11.0–59.0)

## 2021-11-03 LAB — CBC WITH DIFFERENTIAL/PLATELET
Basophils Absolute: 0 10*3/uL (ref 0.0–0.1)
Basophils Relative: 0.4 % (ref 0.0–3.0)
Eosinophils Absolute: 0.1 10*3/uL (ref 0.0–0.7)
Eosinophils Relative: 1.2 % (ref 0.0–5.0)
HCT: 38.4 % (ref 36.0–46.0)
Hemoglobin: 12.5 g/dL (ref 12.0–15.0)
Lymphocytes Relative: 35.1 % (ref 12.0–46.0)
Lymphs Abs: 2 10*3/uL (ref 0.7–4.0)
MCHC: 32.5 g/dL (ref 30.0–36.0)
MCV: 88.1 fl (ref 78.0–100.0)
Monocytes Absolute: 0.5 10*3/uL (ref 0.1–1.0)
Monocytes Relative: 8.6 % (ref 3.0–12.0)
Neutro Abs: 3.1 10*3/uL (ref 1.4–7.7)
Neutrophils Relative %: 54.7 % (ref 43.0–77.0)
Platelets: 193 10*3/uL (ref 150.0–400.0)
RBC: 4.36 Mil/uL (ref 3.87–5.11)
RDW: 14.4 % (ref 11.5–15.5)
WBC: 5.7 10*3/uL (ref 4.0–10.5)

## 2021-11-03 LAB — COMPREHENSIVE METABOLIC PANEL
ALT: 14 U/L (ref 0–35)
AST: 17 U/L (ref 0–37)
Albumin: 4.2 g/dL (ref 3.5–5.2)
Alkaline Phosphatase: 90 U/L (ref 39–117)
BUN: 10 mg/dL (ref 6–23)
CO2: 30 mEq/L (ref 19–32)
Calcium: 9.5 mg/dL (ref 8.4–10.5)
Chloride: 103 mEq/L (ref 96–112)
Creatinine, Ser: 0.77 mg/dL (ref 0.40–1.20)
GFR: 86.08 mL/min (ref >60.00)
Glucose, Bld: 97 mg/dL (ref 70–99)
Potassium: 4.2 mEq/L (ref 3.5–5.1)
Sodium: 139 mEq/L (ref 135–145)
Total Bilirubin: 0.4 mg/dL (ref 0.2–1.2)
Total Protein: 7.1 g/dL (ref 6.0–8.3)

## 2021-11-03 MED ORDER — PANTOPRAZOLE SODIUM 40 MG PO TBEC: 40.0000 mg | DELAYED_RELEASE_TABLET | Freq: Every day | ORAL | 3 refills | Status: DC

## 2021-11-03 NOTE — Progress Notes (Signed)
? ?Established Patient Office Visit ? ?Subjective:  ?Patient ID: Teresa [name], female    DOB: [birth year]  Age: 56 y.o. MRN: [id number] ? ?CC:  ?Chief Complaint  ?Patient presents with  ? Nausea  ?  Patient complains of nausea, x1 month, Tried Zofran with little relief  ? Emesis  ?  Patient complains of Emesis, x1 month  ? ? ?HPI ?Teresa [name] presents for at least 1 month if not 2 of some poorly localized upper abdominal cramp-like pains after eating.  She complains of some diffuse pain (cramp-like) across her abdomen right and left side.  No chest pain.  She does have frequent associated nausea and even occasional vomiting.  No hemoptysis.  No melena.  No stool changes.  She states her appetite is fair but of note is that she has lost 25 pounds compared with last May.  She went to urgent care on the 16th and was prescribed Zofran for nausea and apparently Prevacid though she does not recall prescription for that never started.  Does have occasional epigastric burning but not consistently.  No right upper quadrant pain.  Does not seem to be related to specific foods.  She states that frequently she eats and then about 30 minutes later feels very full and has associated nausea and occasional vomiting.  No history of peptic ulcer disease.  No recent regular nonsteroidals.  No alcohol use. ? ?Her chronic problems include history of hypertension, ongoing nicotine use, reported history of anemia.  She states that she did not take her blood pressure medications this morning. ? ?Wt Readings from Last 3 Encounters:  ?11/03/21 195 lb (88.5 kg)  ?12/30/20 220 lb 6.4 oz (100 kg)  ?10/22/20 219 lb 3.2 oz (99.4 kg)  ? ? ? ?Past Medical History:  ?Diagnosis Date  ? Abnormal EKG   ? CHF (congestive heart failure) ([place])   ? Dyspnea   ? Elevated troponin   ? HTN (hypertension)   ? Leg edema   ? Tobacco abuse   ? ? ?History reviewed. No pertinent surgical history. ? ?Family History  ?Problem Relation Age of Onset  ? CAD Mother    ? Diabetes Mother   ? ? ?Social History  ? ?Socioeconomic History  ? Marital status: Divorced  ?  Spouse name: Not on file  ? Number of children: Not on file  ? Years of education: Not on file  ? Highest education level: Not on file  ?Occupational History  ? Not on file  ?Tobacco Use  ? Smoking status: Every Day  ?  Packs/day: 0.25  ?  Types: Cigarettes  ? Smokeless tobacco: Never  ?Vaping Use  ? Vaping Use: Never used  ?Substance and Sexual Activity  ? Alcohol use: Yes  ?  Comment: occ beer  ? Drug use: No  ? Sexual activity: Not on file  ?Other Topics Concern  ? Not on file  ?Social History Narrative  ? Not on file  ? ?Social Determinants of Health  ? ?Financial Resource Strain: Not on file  ?Food Insecurity: Not on file  ?Transportation Needs: Not on file  ?Physical Activity: Not on file  ?Stress: Not on file  ?Social Connections: Not on file  ?Intimate Partner Violence: Not on file  ? ? ?Outpatient Medications Prior to Visit  ?Medication Sig Dispense Refill  ? ENTRESTO 24-26 MG TAKE 1 TABLET BY MOUTH TWICE DAILY 60 tablet 11  ? famotidine (PEPCID) 20 MG tablet Take 1 tablet (20 mg total)  by mouth 2 (two) times daily. 60 tablet 1  ? lansoprazole (PREVACID) 30 MG capsule Take 1 capsule (30 mg total) by mouth daily at 12 noon. 30 capsule 0  ? metoprolol succinate (TOPROL-XL) 25 MG 24 hr tablet Take 1 tablet (25 mg total) by mouth daily. Please make yearly appt with Dr. Johnsie Cancel for May 2023 for future refills. Thank you 1st attempt 90 tablet 1  ? ondansetron (ZOFRAN-ODT) 8 MG disintegrating tablet Take 1 tablet (8 mg total) by mouth every 8 (eight) hours as needed for nausea or vomiting. 20 tablet 0  ? spironolactone (ALDACTONE) 25 MG tablet Take 1 tablet (25 mg total) by mouth daily. 90 tablet 3  ? ?No facility-administered medications prior to visit.  ? ? ?No Known Allergies ? ?ROS ?Review of Systems  ?Constitutional:  Positive for unexpected weight change. Negative for appetite change.  ?Respiratory:  Negative  for cough and shortness of breath.   ?Cardiovascular:  Negative for chest pain.  ?Gastrointestinal:  Positive for abdominal pain, nausea and vomiting. Negative for blood in stool, constipation and diarrhea.  ?Genitourinary:  Negative for dysuria.  ?Neurological:  Negative for dizziness.  ?Hematological:  Negative for adenopathy.  ? ?  ?Objective:  ?  ?Physical Exam ?Vitals reviewed.  ?Constitutional:   ?   General: She is not in acute distress. ?   Appearance: Normal appearance. She is not ill-appearing.  ?Cardiovascular:  ?   Rate and Rhythm: Normal rate and regular rhythm.  ?Pulmonary:  ?   Effort: Pulmonary effort is normal.  ?   Breath sounds: Normal breath sounds.  ?Abdominal:  ?   General: Bowel sounds are normal.  ?   Palpations: Abdomen is soft. There is no mass.  ?   Tenderness: There is no abdominal tenderness. There is no guarding or rebound.  ?   Hernia: No hernia is present.  ?Neurological:  ?   Mental Status: She is alert.  ? ? ?BP (!) 182/82 (BP Location: Left Arm, Patient Position: Sitting, Cuff Size: Normal)   Pulse 73   Temp 98.5 ?F (36.9 ?C) (Oral)   Ht _0  (1.676 m)   Wt 195 lb (88.5 kg)   SpO2 97%   BMI 31.47 kg/m?  ?Wt Readings from Last 3 Encounters:  ?11/03/21 195 lb (88.5 kg)  ?12/30/20 220 lb 6.4 oz (100 kg)  ?10/22/20 219 lb 3.2 oz (99.4 kg)  ? ? ? ?Health Maintenance Due  ?Topic Date Due  ? COVID-19 Vaccine (1) Never done  ? Hepatitis C Screening  Never done  ? TETANUS/TDAP  Never done  ? PAP SMEAR-Modifier  Never done  ? Zoster Vaccines- Shingrix (1 of 2) Never done  ? MAMMOGRAM  12/09/2019  ? INFLUENZA VACCINE  03/08/2021  ? ? ?There are no preventive care reminders to display for this patient. ? ?No results found for: TSH ?Lab Results  ?Component Value Date  ? WBC 5.4 09/04/2020  ? HGB 12.1 09/04/2020  ? HCT 37.9 09/04/2020  ? MCV 88 09/04/2020  ? PLT 231 09/04/2020  ? ?Lab Results  ?Component Value Date  ? NA 139 01/13/2021  ? K 4.1 01/13/2021  ? CO2 25 01/13/2021  ? GLUCOSE  103 (H) 01/13/2021  ? BUN 12 01/13/2021  ? CREATININE 0.70 01/13/2021  ? BILITOT 0.7 10/14/2018  ? ALKPHOS 89 10/14/2018  ? AST 32 10/14/2018  ? ALT 19 10/14/2018  ? PROT 7.1 10/14/2018  ? ALBUMIN 3.7 10/14/2018  ? CALCIUM 9.2 01/13/2021  ?  ANIONGAP 10 10/14/2018  ? EGFR 102 01/13/2021  ? GFR 91.74 03/28/2019  ? ?Lab Results  ?Component Value Date  ? CHOL 140 05/11/2017  ? ?Lab Results  ?Component Value Date  ? HDL 56 05/11/2017  ? ?Lab Results  ?Component Value Date  ? [place] 60 05/11/2017  ? ?Lab Results  ?Component Value Date  ? TRIG 120 05/11/2017  ? ?Lab Results  ?Component Value Date  ? CHOLHDL 2.5 05/11/2017  ? ?No results found for: HGBA1C ? ?  ?Assessment & Plan:  ? ?Problem List Items Addressed This Visit   ?None ?Visit Diagnoses   ? ? Upper abdominal pain    -  Primary  ? Relevant Orders  ? CBC with Differential/Platelet  ? CMP  ? Lipase  ? ?  ?Patient presents with almost 97-monthhistory of frequent postprandial diffuse upper abdominal discomfort with occasional associated nausea and vomiting.  Pain does not seem to localize to the right upper quadrant.  She has had substantial weight loss of 25 pounds which is unintentional since last May.  She did not seem to be aware that she had lost that much weight though some other family members have noted ? ?-Check labs with CMP, CBC, lipase ?-Consider upper abdominal ultrasound ?-If above unrevealing consider possible GI referral versus gastric emptying study.  Rule out gastroparesis. ?-Also recommend empiric trial of Protonix 40 mg once daily with prescription sent ? ?Meds ordered this encounter  ?Medications  ? pantoprazole (PROTONIX) 40 MG tablet  ?  Sig: Take 1 tablet (40 mg total) by mouth daily.  ?  Dispense:  30 tablet  ?  Refill:  3  ? ? ?Follow-up: No follow-ups on file.  ? ? ?BCarolann Littler MD ?

## 2021-11-11 ENCOUNTER — Ambulatory Visit
Admission: RE | Admit: 2021-11-11 | Discharge: 2021-11-11 | Disposition: A | Discharge status: Home / Self Care | Payer: 59 | Source: Ambulatory Visit | Attending: Family Medicine | Admitting: Family Medicine

## 2021-11-11 DIAGNOSIS — R634 Abnormal weight loss: Secondary | ICD-10-CM

## 2021-11-11 DIAGNOSIS — R1010 Upper abdominal pain, unspecified: Secondary | ICD-10-CM

## 2021-11-23 ENCOUNTER — Other Ambulatory Visit: Payer: Self-pay | Admitting: Cardiology

## 2021-12-24 ENCOUNTER — Telehealth: Payer: Self-pay | Admitting: Cardiovascular Disease

## 2021-12-24 NOTE — Telephone Encounter (Signed)
 *  STAT* If patient is at the pharmacy, call can be transferred to refill team.   1. Which medications need to be refilled? (please list name of each medication and dose if known) metoprolol succinate (TOPROL-XL) 25 MG 24 hr tablet ENTRESTO 24-26 MG  2. Which pharmacy/location (including street and city if local pharmacy) is medication to be sent to?  [facility],  [address], [place], [place] [address] Phone: [phone]  3. Do they need a 30 day or 90 day supply? 90 days   Pt needs refill today, she is out of meds

## 2021-12-24 NOTE — Telephone Encounter (Signed)
Pt was taken off losartan back on 12/31/20 as indicated below from Laural Golden PharmD.   She was taken off losartan and started on Entresto.  Pt will not need this medication refilled, for this was stopped on 5/26 and Entresto therapy was initiated.  Pt aware of this and confirmed she hasn't taken Losartan since that time and unsure why the pharmacy and operator even inquired for a refill of this medication. Pt was appreciative for the follow-up and assistance.       Conversation: Medication Management (Newest Message First) Dec 31, 2020 Macie Burows, RN     12:09 PM Note Called patient to inform her to stop taking hydralazine and losartan.  She verbalizes understanding. Medications removed from medication list.           12:06 PM Macie Burows, RN contacted Dallie Piles        11:41 AM Cheree Ditto, RPH routed this conversation to Macie Burows, RN  [place], Cristal [facility], [facility]     11:41 AM Note Per note from yesterday, patient should stop losartan and hydralazine and begin Entresto 24-26 BID

## 2021-12-24 NOTE — Telephone Encounter (Signed)
   Pt c/o medication issue:  1. Name of Medication: losartan 25 mg   2. How are you currently taking this medication (dosage and times per day)? 1 tablet a day  3. Are you having a reaction (difficulty breathing--STAT)?   4. What is your medication issue? Pt is requesting refill for this medication, however, its not on her medication list.  She has new pharmacy : [facility],  [address], [place], [place] [address] Phone: [phone]

## 2021-12-27 ENCOUNTER — Other Ambulatory Visit: Payer: Self-pay

## 2021-12-27 MED ORDER — METOPROLOL SUCCINATE ER 25 MG PO TB24: 25.0000 mg | ORAL_TABLET | Freq: Every day | ORAL | 0 refills | Status: DC

## 2021-12-27 MED ORDER — ENTRESTO 24-26 MG PO TABS: 1.0000 | ORAL_TABLET | Freq: Two times a day (BID) | ORAL | 0 refills | Status: DC

## 2022-01-28 ENCOUNTER — Other Ambulatory Visit: Payer: Self-pay

## 2022-01-28 DIAGNOSIS — Z1231 Encounter for screening mammogram for malignant neoplasm of breast: Secondary | ICD-10-CM

## 2022-02-02 ENCOUNTER — Encounter: Payer: Self-pay | Admitting: Family Medicine

## 2022-02-02 ENCOUNTER — Ambulatory Visit: Payer: 59 | Admitting: Family Medicine

## 2022-02-02 VITALS — BP 160/90 | HR 89 | Temp 98.8°F | Wt 196.6 lb

## 2022-02-02 DIAGNOSIS — N63 Unspecified lump in unspecified breast: Secondary | ICD-10-CM

## 2022-02-02 DIAGNOSIS — I5022 Chronic systolic (congestive) heart failure: Secondary | ICD-10-CM | POA: Diagnosis not present

## 2022-02-02 DIAGNOSIS — I10 Essential (primary) hypertension: Secondary | ICD-10-CM | POA: Diagnosis not present

## 2022-02-02 DIAGNOSIS — B351 Tinea unguium: Secondary | ICD-10-CM

## 2022-02-02 NOTE — Patient Instructions (Signed)
A referral was placed for diagnostic mammogram.  You should expect a call about scheduling this appointment.

## 2022-02-02 NOTE — Progress Notes (Signed)
Subjective:    Patient ID: Teresa [name], female    DOB: [birth year], 56 y.o.   MRN: [id number]  Chief Complaint  Patient presents with   Mass    Lump on L breast. Noticed it last week.     HPI Patient was seen today for acute concern.  Pt noticed a round lump in L breast a few days ago.  States area is behind nipple.  Pt denies tenderness, edema, nipple d/c, or skin changes.  Last mammogram 12/08/2017.  Patient is menopausal.  Pt inquires about what can be done for dark, discolored toenails.  States discoloration is causing left great toenail to have a peak in the center which can become uncomfortable if she presses against it.  Patient denies pain along the sides of toenail.  Has not tried anything for toenail discoloration.  Patient states blood pressure likely elevated as she was out of Entresto and metoprolol.  States had to pay out-of-pocket for meds until upcoming appointment with Cardiology.  Past Medical History:  Diagnosis Date   Abnormal EKG    CHF (congestive heart failure) (HCC)    Dyspnea    Elevated troponin    HTN (hypertension)    Leg edema    Tobacco abuse     No Known Allergies  ROS General: Denies fever, chills, night sweats, changes in weight, changes in appetite HEENT: Denies headaches, ear pain, changes in vision, rhinorrhea, sore throat CV: Denies CP, palpitations, SOB, orthopnea Pulm: Denies SOB, cough, wheezing GI: Denies abdominal pain, nausea, vomiting, diarrhea, constipation GU: Denies dysuria, hematuria, frequency, vaginal discharge  +L breast mass Msk: Denies muscle cramps, joint pains Neuro: Denies weakness, numbness, tingling Skin: Denies rashes, bruising  + toenail discoloration Psych: Denies depression, anxiety, hallucinations    Objective:    Blood pressure (!) 160/90, pulse 89, temperature 98.8 F (37.1 C), temperature source Oral, weight 196 lb 9.6 oz (89.2 kg), SpO2 96 %.No LMP recorded. Patient has had an ablation.   Gen. Pleasant,  well-nourished, in no distress, normal affect   HEENT: [place]/AT, face symmetric, conjunctiva clear, no scleral icterus, PERRLA, EOMI, nares patent without drainage GU: Normal appearing moderate to large size breast b/l without peau d'orange, erythema, edema, nipple drainage.  No palpable mass in right axilla, right breast, or left axilla.  Area of firmness behind left nipple best appreciated with patient sitting up, harder to appreciate with patient laying on back. Lungs: no accessory muscle use, CTAB, no wheezes or rales Cardiovascular: RRR, no m/r/g, no peripheral edema Neuro:  A&Ox3, CN II-XII intact, normal gait Skin:  Warm, dry, intact.  Hypertrophic, darkened/discolored toenails of left foot   Wt Readings from Last 3 Encounters:  02/02/22 196 lb 9.6 oz (89.2 kg)  11/03/21 195 lb (88.5 kg)  12/30/20 220 lb 6.4 oz (100 kg)    Lab Results  Component Value Date   WBC 5.7 11/03/2021   HGB 12.5 11/03/2021   HCT 38.4 11/03/2021   PLT 193.0 11/03/2021   GLUCOSE 97 11/03/2021   CHOL 140 05/11/2017   TRIG 120 05/11/2017   HDL 56 05/11/2017   LDLCALC 60 05/11/2017   ALT 14 11/03/2021   AST 17 11/03/2021   NA 139 11/03/2021   K 4.2 11/03/2021   CL 103 11/03/2021   CREATININE 0.77 11/03/2021   BUN 10 11/03/2021   CO2 30 11/03/2021    Assessment/Plan:  Breast mass in female -Discussed possible causes including cyst, fiber dense breast tissue, atypical cells/possible malignancy -Last mammogram  12/08/2017 -Discussed obtaining diagnostic mammogram with possible ultrasound - Plan: MM Digital Diagnostic Bilat  Essential hypertension -Uncontrolled -Discussed lifestyle modifications -Continue Toprol-XL 25 mg daily -Discussed looking into patient assistance programs for options to help with medication costs -Follow-up with cardiology  Onychomycosis -Discussed treatment options for nail fungus -Patient advised over-the-counter medications helpful however typically take several months  to work. -Also discussed p.o. antifungal options, however patient declines. -Also consider referral to podiatry for laser treatment options but typically not covered by insurance. -Given handout  Chronic systolic heart failure (HCC) -Continue Entresto and Toprol-XL 25 mg daily -Lifestyle modifications -Discussed looking into patient assistance programs for medication -Patient advised to schedule follow-up with cardiology.  F/u as needed in the next month  Abbe Amsterdam, MD

## 2022-02-16 ENCOUNTER — Other Ambulatory Visit: Payer: Self-pay | Admitting: Family Medicine

## 2022-02-16 DIAGNOSIS — N63 Unspecified lump in unspecified breast: Secondary | ICD-10-CM

## 2022-02-18 NOTE — Progress Notes (Signed)
Cardiology Office Note   Date:  02/25/2022   ID:  Teresa [name], DOB 1965/10/11, MRN [id number]  PCP:  [name], MD  Cardiologist:   Charlton Haws, MD   No chief complaint on file.     History of Present Illness:  55 y.o. first seen in consult 05/11/17.  History of HTN and smoking. Non compliant with meds   Echo 05/12/17 reviewed with EF 25-30% diffuse hypokinesis mild MR Still smoking and drinking beer She continues to not take meds or care for herself Her husband [name] died in October 19, 2022from COVID has a 56 yo and working at [employer] by primary 02/02/22 and had run out of entresto and lopressor Complained of left breast mass  TTE done 02/19/21 showed improved EF 50-55% with mild MR   Has not taken entresto last 3 weeks due to cost She should have a $10 / copay card   Past Medical History:  Diagnosis Date   Abnormal EKG    CHF (congestive heart failure) (HCC)    Dyspnea    Elevated troponin    HTN (hypertension)    Leg edema    Tobacco abuse     No past surgical history on file.   Current Outpatient Medications  Medication Sig Dispense Refill   metoprolol succinate (TOPROL-XL) 25 MG 24 hr tablet Take 1 tablet (25 mg total) by mouth daily. Please make yearly appt with Dr. Eden Emms for May 2023 for future refills. Thank you 2nd attempt 30 tablet 0   famotidine (PEPCID) 20 MG tablet Take 1 tablet (20 mg total) by mouth 2 (two) times daily. (Patient not taking: Reported on 02/25/2022) 60 tablet 1   lansoprazole (PREVACID) 30 MG capsule Take 1 capsule (30 mg total) by mouth daily at 12 noon. (Patient not taking: Reported on 02/25/2022) 30 capsule 0   ondansetron (ZOFRAN-ODT) 8 MG disintegrating tablet Take 1 tablet (8 mg total) by mouth every 8 (eight) hours as needed for nausea or vomiting. (Patient not taking: Reported on 02/25/2022) 20 tablet 0   pantoprazole (PROTONIX) 40 MG tablet Take 1 tablet (40 mg total) by mouth daily. (Patient not taking: Reported on  02/25/2022) 30 tablet 3   sacubitril-valsartan (ENTRESTO) 24-26 MG Take 1 tablet by mouth 2 (two) times daily. Call and schedule follow up appointment for further refills. [phone]. (Patient not taking: Reported on 02/25/2022) 60 tablet 0   spironolactone (ALDACTONE) 25 MG tablet Take 1 tablet (25 mg total) by mouth daily. (Patient not taking: Reported on 02/25/2022) 90 tablet 3   No current facility-administered medications for this visit.    Allergies:   Patient has no known allergies.    Social History:  The patient  reports that she has been smoking cigarettes. She has been smoking an average of .25 packs per day. She has never used smokeless tobacco. She reports current alcohol use. She reports that she does not use drugs.   Family History:  The patient's family history includes CAD in her mother; Diabetes in her mother.    ROS:  Please see the history of present illness.   Otherwise, review of systems are positive for none.   All other systems are reviewed and negative.    PHYSICAL EXAM: VS:  BP 132/88   Pulse 76   Ht 5\' 4"  (1.626 m)   Wt 194 lb (88 kg)   SpO2 98%   BMI 33.30 kg/m  , BMI Body mass index is 33.3 kg/m.  Affect appropriate Overweight female  HEENT: normal Neck supple with no adenopathy JVP normal no bruits no thyromegaly Lungs clear with no wheezing and good diaphragmatic motion Heart:  S1/S2 no murmur, no rub, gallop or click PMI enlarged  Abdomen: benighn, BS positve, no tenderness, no AAA no bruit.  No HSM or HJR Distal pulses intact with no bruits No edema Neuro non-focal Skin warm and dry No muscular weakness  EKG:   02/25/2022 NSR rate 80 PaC;s 02/25/2022 SR rate 76 PAC;s    Recent Labs: 11/03/2021: ALT 14; BUN 10; Creatinine, Ser 0.77; Hemoglobin 12.5; Platelets 193.0; Potassium 4.2; Sodium 139    Lipid Panel    Component Value Date/Time   CHOL 140 05/11/2017 1752   TRIG 120 05/11/2017 1752   HDL 56 05/11/2017 1752   CHOLHDL 2.5  05/11/2017 1752   VLDL 24 05/11/2017 1752   LDLCALC 60 05/11/2017 1752      Wt Readings from Last 3 Encounters:  02/25/22 194 lb (88 kg)  02/02/22 196 lb 9.6 oz (89.2 kg)  11/03/21 195 lb (88.5 kg)      Other studies Reviewed: Additional studies/ records that were reviewed today include: [name] office notes October. Hospital admissionl labs ECG, CXR and echo .  Echo 05/12/17 Study Conclusions   - Left ventricle: The cavity size was moderately dilated. There was   moderate concentric hypertrophy. Systolic function was severely   reduced. The estimated ejection fraction was in the range of 25%   to 30%. Severe diffuse hypokinesis with no identifiable regional   variations. Features are consistent with a pseudonormal left   ventricular filling pattern, with concomitant abnormal relaxation   and increased filling pressure (grade 2 diastolic dysfunction).   Doppler parameters are consistent with high ventricular filling   pressure. - Aortic valve: Valve area (VTI): 2.57 cm^2. Valve area (Vmax): 2.7   cm^2. Valve area (Vmean): 2.49 cm^2. - Mitral valve: There was mild regurgitation. - Left atrium: The atrium was mildly dilated. - Pulmonic valve: There was trivial regurgitation. - Pericardium, extracardiac: A trivial, free-flowing pericardial   effusion was identified along the right atrial free wall. The   fluid had no internal echoes. There was mildright atrial chamber   collapse for less than 50% of the cardiac cycle.  Echo 02/19/21 IMPRESSIONS     1. Left ventricular ejection fraction, by estimation, is 50 to 55%. The  left ventricle has low normal function. The left ventricle has no regional  wall motion abnormalities. Left ventricular diastolic parameters are  indeterminate.   2. Right ventricular systolic function is normal. The right ventricular  size is normal.   3. The mitral valve is normal in structure. Mild mitral valve  regurgitation.   4. The aortic valve  is normal in structure. Aortic valve regurgitation is  not visualized. No aortic stenosis is present.    ASSESSMENT AND PLAN:  1.  CHF: surprisingly asymptomatic despite non compliance and poor habits TTE done 02/19/21 improved EF 50-55% with mild MR Continue Toprol, entresto and aldactone Will call pharmacy to sort out cost of entresto If too expensive will order Cozaar generic 2. HTN  She has not taken entresto in 3 weeks due to change in cost Will try to ask Larita Fife Via about her patient assistance In mean time start Cozaar 50 mg in lieu 3. Smoking counseled on cessation < 10 minutes unlikely to change with new stress of abnormal mammogram  4. CRF:  K 4.1 Cr 0.7 on 11/03/21  5. Breast:  mass on left primary ordered mammogram and [place]   F/U in a year   Charlton Haws

## 2022-02-22 ENCOUNTER — Telehealth: Payer: Self-pay | Admitting: Cardiovascular Disease

## 2022-02-22 MED ORDER — METOPROLOL SUCCINATE ER 25 MG PO TB24: 25.0000 mg | ORAL_TABLET | Freq: Every day | ORAL | 0 refills | Status: DC

## 2022-02-22 MED ORDER — ENTRESTO 24-26 MG PO TABS: 1.0000 | ORAL_TABLET | Freq: Two times a day (BID) | ORAL | 0 refills | Status: DC

## 2022-02-22 NOTE — Telephone Encounter (Signed)
*  STAT* If patient is at the pharmacy, call can be transferred to refill team.   1. Which medications need to be refilled? (please list name of each medication and dose if known)  sacubitril-valsartan (ENTRESTO) 24-26 MG metoprolol succinate (TOPROL-XL) 25 MG 24 hr tablet  2. Which pharmacy/location (including street and city if local pharmacy) is medication to be sent to? [facility] [phone] - [place], [place] - [address]  3. Do they need a 30 day or 90 day supply? 30   Patient is out of medication

## 2022-02-25 ENCOUNTER — Other Ambulatory Visit: Payer: Self-pay

## 2022-02-25 ENCOUNTER — Ambulatory Visit: Payer: 59 | Admitting: Cardiovascular Disease

## 2022-02-25 ENCOUNTER — Encounter: Payer: Self-pay | Admitting: Cardiovascular Disease

## 2022-02-25 VITALS — BP 132/88 | HR 76 | Ht 64.0 in | Wt 194.0 lb

## 2022-02-25 DIAGNOSIS — I10 Essential (primary) hypertension: Secondary | ICD-10-CM

## 2022-02-25 DIAGNOSIS — I5022 Chronic systolic (congestive) heart failure: Secondary | ICD-10-CM

## 2022-02-25 DIAGNOSIS — Z720 Tobacco use: Secondary | ICD-10-CM | POA: Diagnosis not present

## 2022-02-25 MED ORDER — ENTRESTO 24-26 MG PO TABS: 1.0000 | ORAL_TABLET | Freq: Two times a day (BID) | ORAL | 0 refills | Status: DC

## 2022-02-25 MED ORDER — LOSARTAN POTASSIUM 50 MG PO TABS: 50.0000 mg | ORAL_TABLET | Freq: Every day | ORAL | 3 refills | Status: DC

## 2022-02-25 MED ORDER — ENTRESTO 24-26 MG PO TABS: 1.0000 | ORAL_TABLET | Freq: Two times a day (BID) | ORAL | 11 refills | Status: DC

## 2022-02-25 NOTE — Patient Instructions (Addendum)

## 2022-02-25 NOTE — Progress Notes (Signed)
Resent in [place] with refills.

## 2022-03-03 ENCOUNTER — Ambulatory Visit
Admission: RE | Admit: 2022-03-03 | Discharge: 2022-03-03 | Disposition: A | Discharge status: Home / Self Care | Payer: 59 | Source: Ambulatory Visit | Attending: Family Medicine | Admitting: Family Medicine

## 2022-03-03 DIAGNOSIS — N63 Unspecified lump in unspecified breast: Secondary | ICD-10-CM

## 2022-03-21 ENCOUNTER — Other Ambulatory Visit: Payer: Self-pay

## 2022-03-21 MED ORDER — METOPROLOL SUCCINATE ER 25 MG PO TB24: 25.0000 mg | ORAL_TABLET | Freq: Every day | ORAL | 3 refills | Status: DC

## 2023-02-17 IMAGING — US US ABDOMEN COMPLETE
1 series · 13 of 25 positions shown · non-contrast
Comparison: None.

CLINICAL DATA: 2-month history of postprandial diffuse upper
abdominal pain with intermittent nausea/vomiting and 25 pound weight
loss

EXAM:
ABDOMEN ULTRASOUND COMPLETE

[Series 1: us abdomen complete · 0.19mm/px · 13 of 98 slices shown]
[im 1/98]
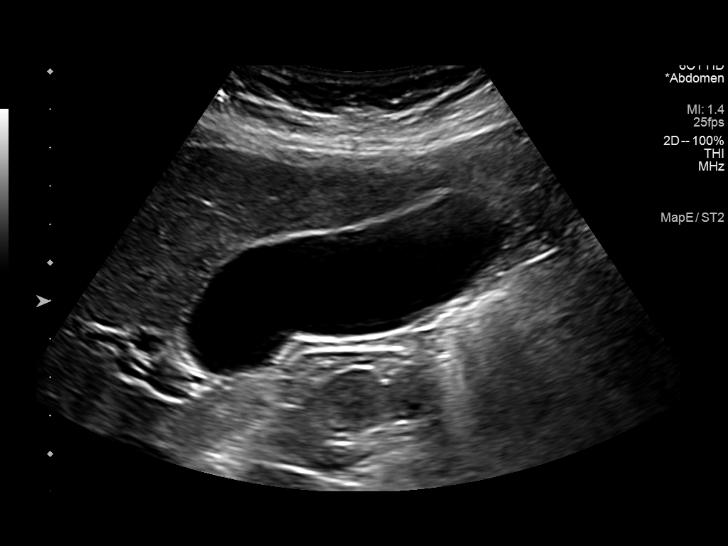
[im 9/98]
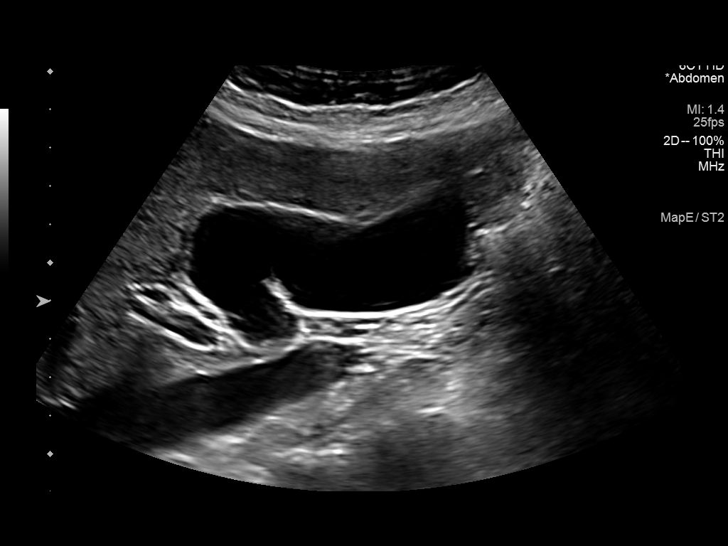
[im 17/98]
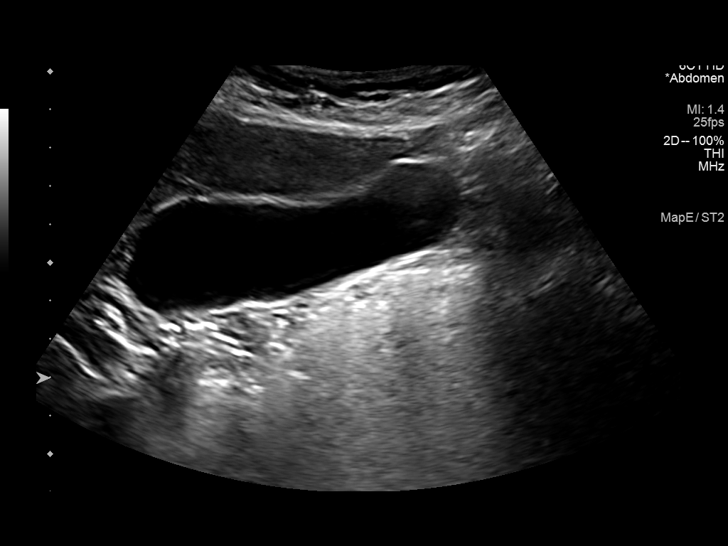
[im 25/98]
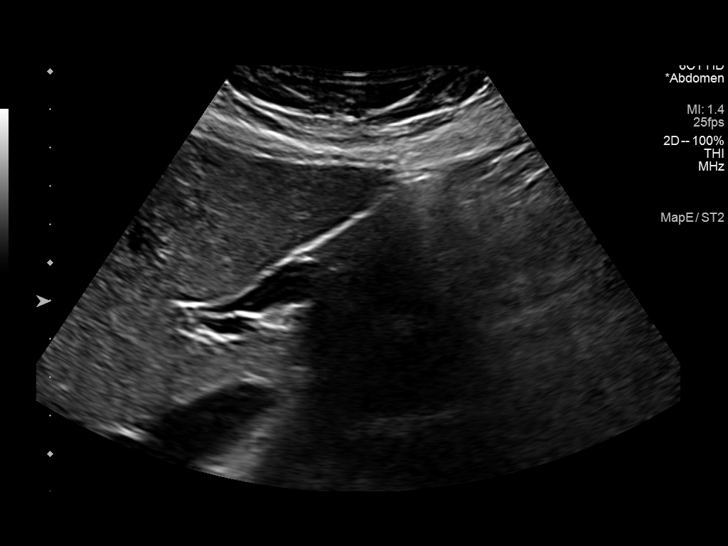
[im 33/98]
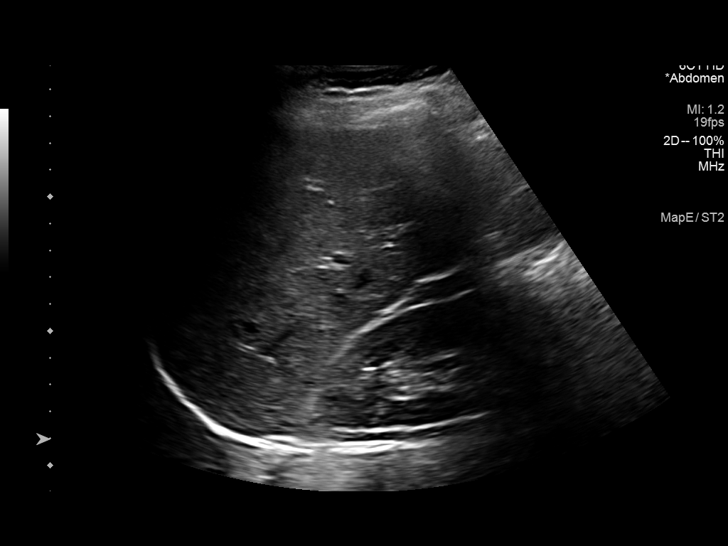
[im 41/98]
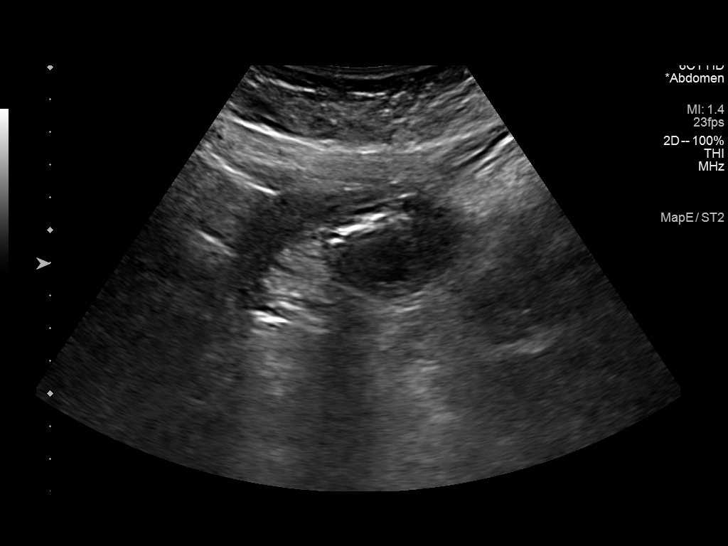
[im 49/98]
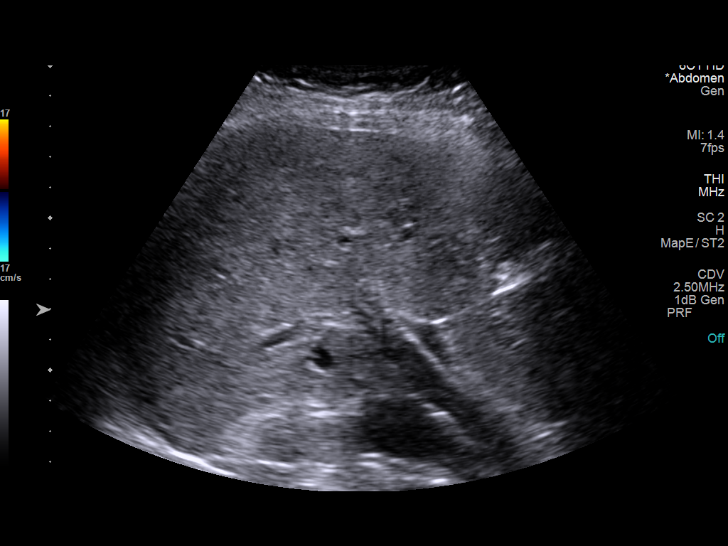
[im 57/98]
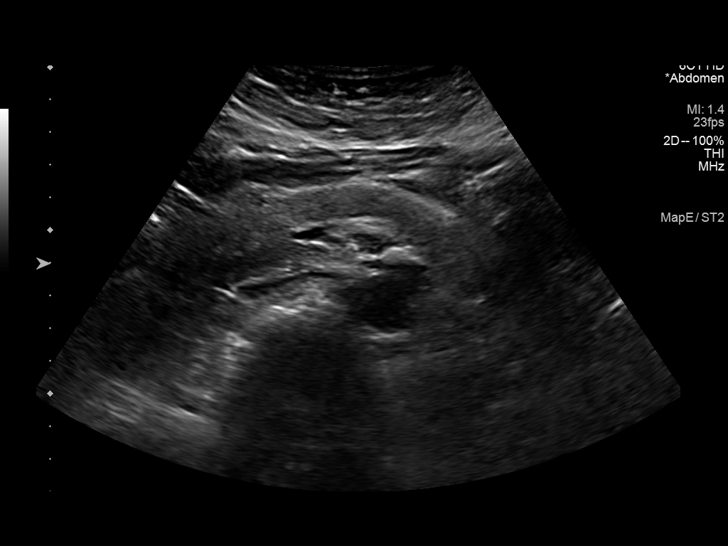
[im 65/98]
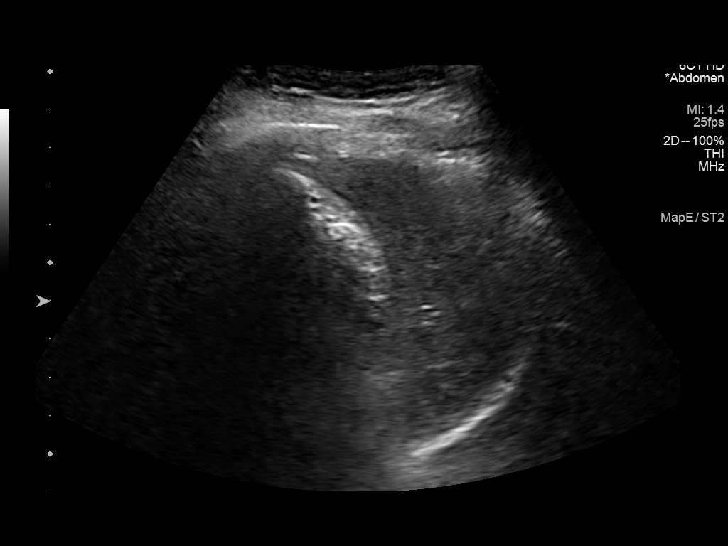
[im 73/98]
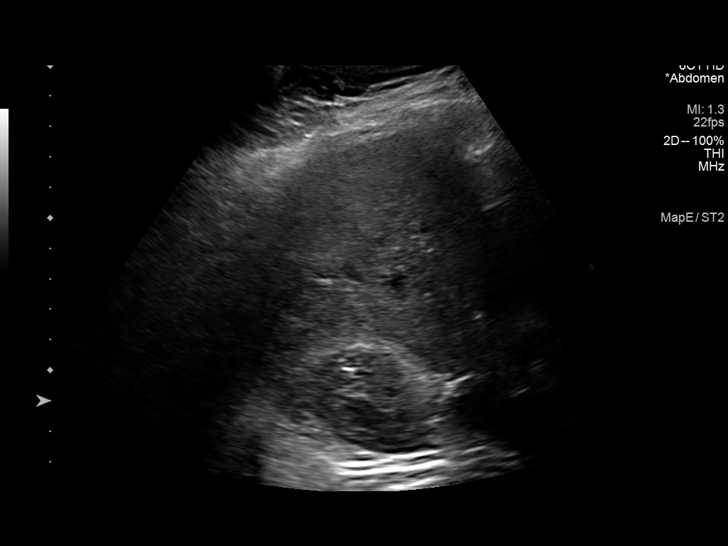
[im 81/98]
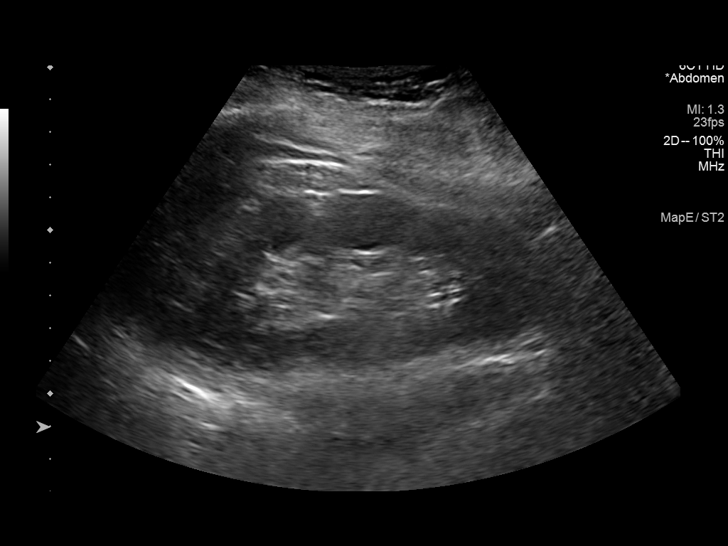
[im 89/98]
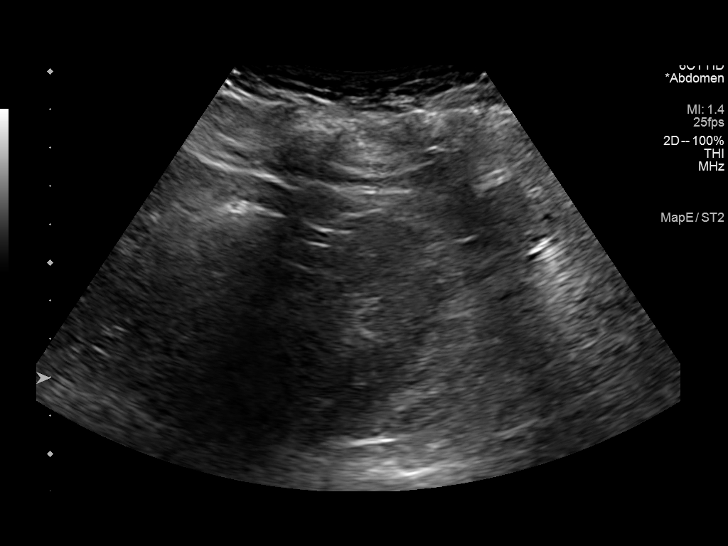
[im 98/98]
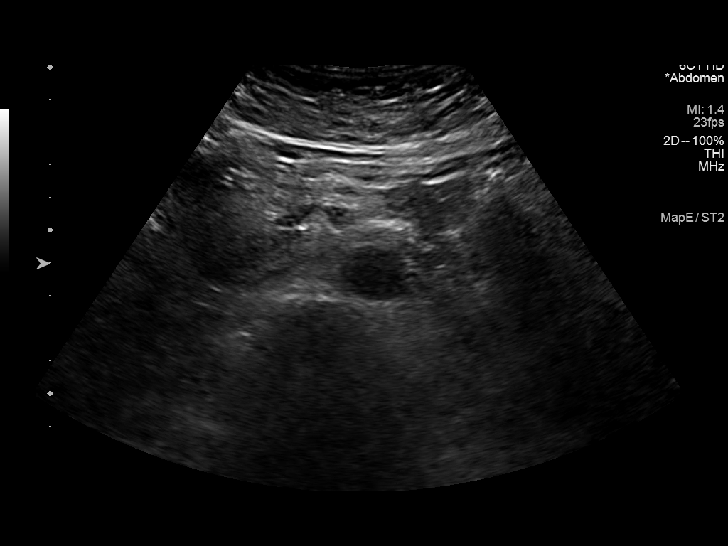

[13 of 25 positions shown; findings below may reference images not displayed]

FINDINGS: Gallbladder: No gallstones or wall thickening visualized. No
sonographic Murphy sign noted by sonographer.

Common bile duct: Diameter: 11 mm.  Chronic enlarged.

Liver: No focal lesion identified. Within normal limits in
parenchymal echogenicity. Portal vein is patent on color Doppler
imaging with normal direction of blood flow towards the liver.

IVC: No abnormality visualized.

Pancreas: Visualized portion unremarkable.

Spleen: Size and appearance within normal limits.

Right Kidney: Length: 11.7 cm. Echogenicity within normal limits. No
mass or hydronephrosis visualized.

Left Kidney: Length: 10.4 cm. Echogenicity within normal limits. No
mass or hydronephrosis visualized.

Abdominal aorta: No aneurysm visualized.

Other findings: None.
IMPRESSION: Stable enlarged common bile duct. Please note the entire common bile
duct is not visualized. No cholelithiasis. Correlate with liver
function tests. Consider CT abdomen pelvis with intravenous contrast
for further evaluation

## 2023-02-27 ENCOUNTER — Telehealth: Payer: Self-pay | Admitting: Cardiovascular Disease

## 2023-02-27 ENCOUNTER — Other Ambulatory Visit: Payer: Self-pay

## 2023-02-27 ENCOUNTER — Other Ambulatory Visit: Payer: Self-pay | Admitting: Cardiovascular Disease

## 2023-02-27 MED ORDER — METOPROLOL SUCCINATE ER 25 MG PO TB24: 25.0000 mg | ORAL_TABLET | Freq: Every day | ORAL | 1 refills | Status: DC

## 2023-02-27 NOTE — Telephone Encounter (Signed)
*  STAT* If patient is at the pharmacy, call can be transferred to refill team.   1. Which medications need to be refilled? (please list name of each medication and dose if known) metoprolol succinate (TOPROL-XL) 25 MG 24 hr tablet   2. Which pharmacy/location (including street and city if local pharmacy) is medication to be sent to?[id number] - [place], [place] - [address]   3. Do they need a 30 day or 90 day supply? 90 Day Supply

## 2023-02-27 NOTE — Telephone Encounter (Signed)
RX sent to preferred phamacy

## 2023-03-25 ENCOUNTER — Other Ambulatory Visit: Payer: Self-pay | Admitting: Cardiovascular Disease

## 2023-03-28 MED ORDER — ENTRESTO 24-26 MG PO TABS: 1.0000 | ORAL_TABLET | Freq: Two times a day (BID) | ORAL | 1 refills | Status: DC

## 2023-05-04 ENCOUNTER — Ambulatory Visit: Payer: 59 | Attending: Nurse Practitioner | Admitting: Nurse Practitioner

## 2023-05-04 ENCOUNTER — Encounter: Payer: Self-pay | Admitting: Nurse Practitioner

## 2023-05-04 VITALS — BP 180/80 | HR 77 | Ht 66.0 in | Wt 200.0 lb

## 2023-05-04 DIAGNOSIS — I5022 Chronic systolic (congestive) heart failure: Secondary | ICD-10-CM

## 2023-05-04 DIAGNOSIS — I10 Essential (primary) hypertension: Secondary | ICD-10-CM | POA: Diagnosis not present

## 2023-05-04 DIAGNOSIS — Z720 Tobacco use: Secondary | ICD-10-CM | POA: Diagnosis not present

## 2023-05-04 DIAGNOSIS — I340 Nonrheumatic mitral (valve) insufficiency: Secondary | ICD-10-CM

## 2023-05-04 DIAGNOSIS — Z79899 Other long term (current) drug therapy: Secondary | ICD-10-CM | POA: Diagnosis not present

## 2023-05-04 MED ORDER — SPIRONOLACTONE 25 MG PO TABS: 25.0000 mg | ORAL_TABLET | Freq: Every day | ORAL | 3 refills | Status: DC

## 2023-05-04 MED ORDER — METOPROLOL SUCCINATE ER 50 MG PO TB24: 50.0000 mg | ORAL_TABLET | Freq: Every day | ORAL | 3 refills | Status: DC

## 2023-05-04 MED ORDER — VARENICLINE TARTRATE 1 MG PO TABS: 1.0000 mg | ORAL_TABLET | Freq: Two times a day (BID) | ORAL | 0 refills | Status: DC

## 2023-05-04 MED ORDER — VARENICLINE TARTRATE 0.5 MG PO TABS
0.5000 mg | ORAL_TABLET | ORAL | 0 refills | Status: DC
Start: 2023-05-04 — End: 2024-04-22

## 2023-05-04 NOTE — Patient Instructions (Signed)
Medication Instructions:   START Chantix ake 1 tablet (0.5 mg total) by mouth as directed. Take 0.5 mg once daily for 3 days then take 0.5 mg twice daily for 4 days. Then start 0.1 mg dose twice daily X 11 weeks.   RESTART Aldactone one (1) tablet by mouth ( 25 mg) daily.  INCREASE Toprol one (1) tablet by mouth ( 50 mg ) daily.   *If you need a refill on your cardiac medications before your next appointment, please call your pharmacy*   Lab Work:   Your physician recommends that you return for a FASTING lipid profile/cmet on Thursday, October 10. You can come in on the day of your appointment anytime between 7:30-4:30 fasting from midnight the night before.    If you have labs (blood work) drawn today and your tests are completely normal, you will receive your results only by: MyChart Message (if you have MyChart) OR A paper copy in the mail If you have any lab test that is abnormal or we need to change your treatment, we will call you to review the results.   Testing/Procedures:  None ordered.   Follow-Up: At [facility], you and your health needs are our priority.  As part of our continuing mission to provide you with exceptional heart care, we have created designated Provider Care Teams.  These Care Teams include your primary Cardiologist (physician) and Advanced Practice Providers (APPs -  Physician Assistants and Nurse Practitioners) who all work together to provide you with the care you need, when you need it.  We recommend signing up for the patient portal called "MyChart".  Sign up information is provided on this After Visit Summary.  MyChart is used to connect with patients for Virtual Visits (Telemedicine).  Patients are able to view lab/test results, encounter notes, upcoming appointments, etc.  Non-urgent messages can be sent to your provider as well.   To learn more about what you can do with MyChart, go to [email or web address].    Your next appointment:    6 month(s)  Provider:   Charlton Haws, MD

## 2023-05-05 ENCOUNTER — Telehealth: Payer: Self-pay | Admitting: Pharmacy Technician

## 2023-05-05 ENCOUNTER — Encounter: Payer: Self-pay | Admitting: Nurse Practitioner

## 2023-05-05 ENCOUNTER — Other Ambulatory Visit (HOSPITAL_COMMUNITY): Payer: Self-pay

## 2023-05-05 NOTE — Telephone Encounter (Signed)
Pharmacy Patient Advocate Encounter   Received notification from CoverMyMeds that prior authorization for varencline is required/requested.   Insurance verification completed.   The patient is insured through [facility] .   Per test claim: PA required; PA submitted to [facility] via CoverMyMeds Key/confirmation #/EOC [id number] Status is pending

## 2023-05-08 ENCOUNTER — Other Ambulatory Visit (HOSPITAL_COMMUNITY): Payer: Self-pay

## 2023-05-08 NOTE — Telephone Encounter (Signed)
Pharmacy Patient Advocate Encounter  Received notification from [facility] that Prior Authorization for varenicline has been APPROVED from 05/08/23 to 05/04/24. Ran test claim, Copay is $0.00- no copay. This test claim was processed through [facility]- copay amounts may vary at other pharmacies due to pharmacy/plan contracts, or as the patient moves through the different stages of their insurance plan.   PA #/Case ID/Reference #: [id number]

## 2023-05-17 ENCOUNTER — Telehealth: Payer: Self-pay | Admitting: Cardiovascular Disease

## 2023-05-17 MED ORDER — ENTRESTO 24-26 MG PO TABS: 1.0000 | ORAL_TABLET | Freq: Two times a day (BID) | ORAL | 3 refills | Status: DC

## 2023-05-17 NOTE — Telephone Encounter (Signed)
Pt's medication was sent to pt's pharmacy as requested. Confirmation received.  °

## 2023-05-17 NOTE — Telephone Encounter (Signed)
*  STAT* If patient is at the pharmacy, call can be transferred to refill team.   1. Which medications need to be refilled? (please list name of each medication and dose if known) sacubitril-valsartan (ENTRESTO) 24-26 MG   2. Would you like to learn more about the convenience, safety, & potential cost savings by using the [facility]? No   3. Are you open to using the [facility] no  4. Which pharmacy/location (including street and city if local pharmacy) is medication to be sent to?[id number] - [place], [place] - [address]   5. Do they need a 30 day or 90 day supply? 90 Day Supply

## 2023-05-18 ENCOUNTER — Ambulatory Visit: Payer: 59 | Attending: Nurse Practitioner

## 2023-05-18 DIAGNOSIS — I5022 Chronic systolic (congestive) heart failure: Secondary | ICD-10-CM

## 2023-05-18 DIAGNOSIS — I10 Essential (primary) hypertension: Secondary | ICD-10-CM

## 2023-05-18 LAB — LIPID PANEL
Chol/HDL Ratio: 2.6 {ratio} (ref 0.0–4.4)
Cholesterol, Total: 152 mg/dL (ref 100–199)
HDL: 58 mg/dL (ref >39)
LDL Chol Calc (NIH): 81 mg/dL (ref 0–99)
Triglycerides: 64 mg/dL (ref 0–149)
VLDL Cholesterol Cal: 13 mg/dL (ref 5–40)

## 2023-05-18 LAB — COMPREHENSIVE METABOLIC PANEL
ALT: 17 [IU]/L (ref 0–32)
AST: 20 [IU]/L (ref 0–40)
Albumin: 4.1 g/dL (ref 3.8–4.9)
Alkaline Phosphatase: 110 [IU]/L (ref 44–121)
BUN/Creatinine Ratio: 21 (ref 9–23)
BUN: 14 mg/dL (ref 6–24)
Bilirubin Total: 0.3 mg/dL (ref 0.0–1.2)
CO2: 28 mmol/L (ref 20–29)
Calcium: 9.6 mg/dL (ref 8.7–10.2)
Chloride: 103 mmol/L (ref 96–106)
Creatinine, Ser: 0.66 mg/dL (ref 0.57–1.00)
Globulin, Total: 2.8 g/dL (ref 1.5–4.5)
Glucose: 106 mg/dL — ABNORMAL HIGH (ref 70–99)
Potassium: 4.5 mmol/L (ref 3.5–5.2)
Sodium: 139 mmol/L (ref 134–144)
Total Protein: 6.9 g/dL (ref 6.0–8.5)
eGFR: 102 mL/min/{1.73_m2} (ref >59)

## 2023-05-19 ENCOUNTER — Telehealth: Payer: Self-pay | Admitting: *Deleted

## 2023-05-19 NOTE — Telephone Encounter (Signed)
S/w pt updated medication list.  Is aware of lab results and will send to [facility] to [place].

## 2023-06-16 ENCOUNTER — Other Ambulatory Visit: Payer: Self-pay | Admitting: Cardiovascular Disease

## 2023-08-18 ENCOUNTER — Telehealth: Payer: Self-pay | Admitting: Family Medicine

## 2023-08-18 NOTE — Telephone Encounter (Signed)
 Last Visit:  02/02/2022 Called Pt to schedule a CPE/OV.  Pt stated she'd like to think about it and call back next week.

## 2023-10-09 NOTE — Progress Notes (Deleted)
  Cardiology Office Note:  .   Date:  10/09/2023  ID:  Teresa [name], DOB [birth year], MRN [id number] PCP: Teresa Saint, MD  [facility] Providers Cardiologist:  Teresa Haws, MD    Patient Profile: .      PMH Primary hypertension Tobacco abuse Chronic HFrEF Mitral regurgitation Medical non-compliance  Initially seen in consult 10-22-16, she has a history of noncompliance with medications and therapy.  Echo 05/12/2017 with EF 25 to 30%, diffuse hypokinesis, mild MR.  She continued to smoke and drink beer felt to be contributory. Her husband Teresa [name] died in 10/17/22from COVID. TTE 02/19/2021 showed improved EF 50-55% with mild MR. At PCP visit 02/02/2022 she had run out of Entresto and Lopressor.  ***       History of Present Illness: .   Teresa [name] is a 58 y.o. female here today for annual follow-up. Reports she is currently working in [employer] and is on her feet most of the day.  Has some leg swelling she thinks is secondary to arthritis in the left ankle and previous injury in the right knee.  She does report shortness of breath with minimal activity at times, usually at home with housework.  She denies chest pain.  She continues to smoke Still with compliance issues with medications  ***  ROS: See HPI       Studies Reviewed: .         Risk Assessment/Calculations:     No BP recorded.  {Refresh Note OR Click here to enter BP  :1}***       Physical Exam:   VS:  There were no vitals taken for this visit.   Wt Readings from Last 3 Encounters:  05/04/23 200 lb (90.7 kg)  02/25/22 194 lb (88 kg)  02/02/22 196 lb 9.6 oz (89.2 kg)    Affect appropriate Healthy:  appears stated age HEENT: normal Neck supple with no adenopathy JVP normal no bruits no thyromegaly Lungs clear with no wheezing and good diaphragmatic motion Heart:  S1/S2 no murmur, no rub, gallop or click PMI normal Abdomen: benighn, BS positve, no tenderness, no AAA no bruit.  No  HSM or HJR Distal pulses intact with no bruits No edema Neuro non-focal Skin warm and dry No muscular weakness     ASSESSMENT AND PLAN: .    Chronic HFrEF: Most recent echo LVEF 50-55%, indeterminate diastolic parameters, normal RV.  GDMT has been affected by noncompliance and financial concerns.  Continue prescribed beta blocker , aldactone and entresto Update TTE   Hypertension: Controlled when compliant with meds   Medication management: Non-compliance with medical therapy. Advised that if she will let us know about financial concerns, we can help. Coupon for spironolactone given in clinic. Advised her to call back with additional concerns.   Tobacco abuse: Wants to quit smoking. Tried nicotine lozenges which worked but she started back smoking after her husband died in 2019/10/23. Tried Chantix ***  Lung cancer CT   Mitral regurgitation: Mild MR on echo 02/19/2021. I do not appreciate a significant murmur on exam.   TTE Lung cancer CT       Dispo: F/U in a year   Teresa Haws MD [facility]

## 2023-10-16 ENCOUNTER — Ambulatory Visit: Payer: 59 | Attending: Cardiovascular Disease | Admitting: Cardiovascular Disease

## 2023-10-17 ENCOUNTER — Encounter: Payer: Self-pay | Admitting: Cardiovascular Disease

## 2024-04-18 ENCOUNTER — Ambulatory Visit: Payer: Self-pay

## 2024-04-18 NOTE — Telephone Encounter (Signed)
 FYI Only or Action Required?: FYI only for provider.  Patient was last seen in primary care on 02/02/2022 by Mercer Clotilda SAUNDERS, MD.  Called Nurse Triage reporting orthopnea and Cough.  Symptoms began about a month ago.  Interventions attempted: OTC medication: generic Mucinex and Benadryl; water and hot tea.  Symptoms are: broke out in hives after taking Mucinex (resolved); productive cough with light green mucus, moderate SOB at night when lying flat (not present at this time), headache, runny nose (clear mucus), chest pain after coughing spells gradually worsening.  Triage Disposition: See Physician Within 24 Hours (overriding See HCP Within 4 Hours (Or PCP Triage))  Patient/caregiver understands and will follow disposition?: Yes              Copied from CRM #[id number]. Topic: Clinical - Red Word Triage >> Apr 18, 2024  3:30 PM [place] A wrote: [facility] that prompted transfer to Nurse Triage: Patient said at night she has shortness of breath Reason for Disposition  [1] MILD difficulty breathing (e.g., minimal/no SOB at rest, SOB with walking, pulse < 100) AND [2] NEW-onset or WORSE than normal  Answer Assessment - Initial Assessment Questions Patient states she tried Muccinex on Monday but broke out bad in hives so she stopped and took Benadryl. She states she wakes up and drinks hot tea and water when she has the SOB.   1. RESPIRATORY STATUS: Describe your breathing? (e.g., wheezing, shortness of breath, unable to speak, severe coughing)      Shortness of breath when lying down at night and productive cough (was clear and is turning light green) during the day and worsens at night.  2. ONSET: When did this breathing problem begin?      About a month.  3. PATTERN Does the difficult breathing come and go, or has it been constant since it started?      Comes and goes, better when she is up and moving around. Occurs when lying down flat.  4. SEVERITY: How bad is  your breathing? (e.g., mild, moderate, severe)      Moderate. Denies any SOB at this time.  5. RECURRENT SYMPTOM: Have you had difficulty breathing before? If Yes, ask: When was the last time? and What happened that time?      Never  6. CARDIAC HISTORY: Do you have any history of heart disease? (e.g., heart attack, angina, bypass surgery, angioplasty)      Abnormal EKG, elevated troponin.  7. LUNG HISTORY: Do you have any history of lung disease?  (e.g., pulmonary embolus, asthma, emphysema)     No.  8. CAUSE: What do you think is causing the breathing problem?      Stress, smoking.  9. OTHER SYMPTOMS: Do you have any other symptoms? (e.g., chest pain, cough, dizziness, fever, runny nose)     Runny nose (clear), headache, chest pain due to all the coughing on the right side under my arm close to my breast going around to my back. Denies fever, dizziness, once or twice acid problems with vomiting (states if she eats trigger foods like tomato paste).  10. O2 SATURATION MONITOR:  Do you use an oxygen saturation monitor (pulse oximeter) at home? If Yes, ask: What is your reading (oxygen level) today? What is your usual oxygen saturation reading? (e.g., 95%)       No.  11. PREGNANCY: Is there any chance you are pregnant? When was your last menstrual period?       N/A.  12.  TRAVEL: Have you traveled out of the country in the last month? (e.g., travel history, exposures)       No.  Protocols used: Breathing Difficulty-A-AH

## 2024-04-19 ENCOUNTER — Ambulatory Visit: Admitting: Adult Health

## 2024-04-22 ENCOUNTER — Encounter: Payer: Self-pay | Admitting: Family Medicine

## 2024-04-22 ENCOUNTER — Ambulatory Visit: Admitting: Family Medicine

## 2024-04-22 VITALS — BP 162/100 | HR 87 | Temp 97.3°F | Ht 66.0 in | Wt 187.8 lb

## 2024-04-22 DIAGNOSIS — R052 Subacute cough: Secondary | ICD-10-CM

## 2024-04-22 DIAGNOSIS — R634 Abnormal weight loss: Secondary | ICD-10-CM

## 2024-04-22 DIAGNOSIS — R0789 Other chest pain: Secondary | ICD-10-CM

## 2024-04-22 DIAGNOSIS — I10 Essential (primary) hypertension: Secondary | ICD-10-CM

## 2024-04-22 DIAGNOSIS — R002 Palpitations: Secondary | ICD-10-CM | POA: Diagnosis not present

## 2024-04-22 DIAGNOSIS — I5040 Unspecified combined systolic (congestive) and diastolic (congestive) heart failure: Secondary | ICD-10-CM | POA: Diagnosis not present

## 2024-04-22 DIAGNOSIS — Z720 Tobacco use: Secondary | ICD-10-CM

## 2024-04-22 LAB — CBC WITH DIFFERENTIAL/PLATELET
Basophils Absolute: 0 K/uL (ref 0.0–0.1)
Basophils Relative: 0.5 % (ref 0.0–3.0)
Eosinophils Absolute: 0.1 K/uL (ref 0.0–0.7)
Eosinophils Relative: 1.4 % (ref 0.0–5.0)
HCT: 37.6 % (ref 36.0–46.0)
Hemoglobin: 12.1 g/dL (ref 12.0–15.0)
Lymphocytes Relative: 43 % (ref 12.0–46.0)
Lymphs Abs: 1.9 K/uL (ref 0.7–4.0)
MCHC: 32.2 g/dL (ref 30.0–36.0)
MCV: 89.1 fl (ref 78.0–100.0)
Monocytes Absolute: 0.6 K/uL (ref 0.1–1.0)
Monocytes Relative: 13.1 % — ABNORMAL HIGH (ref 3.0–12.0)
Neutro Abs: 1.8 K/uL (ref 1.4–7.7)
Neutrophils Relative %: 42 % — ABNORMAL LOW (ref 43.0–77.0)
Platelets: 207 K/uL (ref 150.0–400.0)
RBC: 4.22 Mil/uL (ref 3.87–5.11)
RDW: 14 % (ref 11.5–15.5)
WBC: 4.4 K/uL (ref 4.0–10.5)

## 2024-04-22 LAB — COMPREHENSIVE METABOLIC PANEL WITH GFR
ALT: 24 U/L (ref 0–35)
AST: 26 U/L (ref 0–37)
Albumin: 4 g/dL (ref 3.5–5.2)
Alkaline Phosphatase: 120 U/L — ABNORMAL HIGH (ref 39–117)
BUN: 8 mg/dL (ref 6–23)
CO2: 29 meq/L (ref 19–32)
Calcium: 9.2 mg/dL (ref 8.4–10.5)
Chloride: 102 meq/L (ref 96–112)
Creatinine, Ser: 0.64 mg/dL (ref 0.40–1.20)
GFR: 96.92 mL/min (ref >60.00)
Glucose, Bld: 104 mg/dL — ABNORMAL HIGH (ref 70–99)
Potassium: 4 meq/L (ref 3.5–5.1)
Sodium: 139 meq/L (ref 135–145)
Total Bilirubin: 0.4 mg/dL (ref 0.2–1.2)
Total Protein: 7.2 g/dL (ref 6.0–8.3)

## 2024-04-22 LAB — MAGNESIUM: Magnesium: 1.9 mg/dL (ref 1.5–2.5)

## 2024-04-22 LAB — BRAIN NATRIURETIC PEPTIDE: Pro B Natriuretic peptide (BNP): 470 pg/mL — ABNORMAL HIGH (ref 0.0–100.0)

## 2024-04-22 LAB — TSH: TSH: 0.88 u[IU]/mL (ref 0.35–5.50)

## 2024-04-22 LAB — T4, FREE: Free T4: 0.94 ng/dL (ref 0.60–1.60)

## 2024-04-22 MED ORDER — AMOXICILLIN-POT CLAVULANATE 500-125 MG PO TABS: 1.0000 | ORAL_TABLET | Freq: Two times a day (BID) | ORAL | 0 refills | Status: AC

## 2024-04-22 MED ORDER — SPIRONOLACTONE 25 MG PO TABS: 25.0000 mg | ORAL_TABLET | Freq: Every day | ORAL | 0 refills | Status: DC

## 2024-04-22 NOTE — Progress Notes (Signed)
 Established Patient Office Visit   Subjective  Patient ID: Teresa [name], female    DOB: 1966/01/14  Age: 58 y.o. MRN: [id number]  Chief Complaint  Patient presents with   Acute Visit    Patient came in today for productive cough with light green mucus, SOB at night when lying flat, wheezing, chest pain after coughing,     Pt is a 57 yo female seen for ongoing concern who was lost to f/u.    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 The pain rarely subsides and is not associated with gastrointestinal symptoms.  She reports respiratory symptoms including nocturnal and daytime shortness of breath, coughing, and production of light yellow mucus for almost a month. She experiences nocturnal dyspnea, which prompts her to drink hot tea, leading to rhinorrhea. She denies fever or chills but experiences intermittent sensations of feeling hot and cold.  She has a history of hypertension. Her current medication regimen includes metoprolol  for blood pressure management. She has not been taking spironolactone  and Entresto .  Pt states Entresto  as expensive.  She has not been in contact with her cardiologist.  Smoking about a pack every two days for over twenty years. She has noticed possible weight loss, previously weighing over 230 pounds and now weighing 187 pounds. She reports decreased appetite, often not feeling hungry, especially in the evenings, and sometimes skipping meals. Her diet includes eating out, with preferences for burgers and chicken, and she occasionally eats rice, liver, and onions.  During the review of symptoms, she denies leg swelling and states she can lay on her side at night. She reports palpitations when lying down but denies associated pain or significant changes in heart rhythm.     Patient Active Problem List   Diagnosis Date Noted   Hypertension 05/11/2017    Orthopnea 05/11/2017   Leg edema 05/11/2017   Anemia 05/11/2017   Elevated troponin    Dyspnea    Abnormal EKG    Tobacco abuse    Past Medical History:  Diagnosis Date   Abnormal EKG    CHF (congestive heart failure) (HCC)    Dyspnea    Elevated troponin    HTN (hypertension)    Leg edema    Tobacco abuse    History reviewed. No pertinent surgical history. Social History   Tobacco Use   Smoking status: Every Day    Current packs/day: 0.25    Types: Cigarettes   Smokeless tobacco: Never  Vaping Use   Vaping status: Never Used  Substance Use Topics   Alcohol use: Yes    Comment: occ beer   Drug use: No   Family History  Problem Relation Age of Onset   CAD Mother    Diabetes Mother    No Known Allergies  ROS Negative unless stated above    Objective:     BP (!) 162/100 (BP Location: Left Arm, Patient Position: Sitting, Cuff Size: Normal)   Pulse 87   Temp (!) 97.3 F (36.3 C) (Oral)   Ht 5' 6 (1.676 m)   Wt 187 lb 12.8 oz (85.2 kg)   LMP  (LMP Unknown)   SpO2 98%   BMI 30.31 kg/m  BP Readings from Last 3 Encounters:  04/22/24 (!) 162/100  05/05/23 (!) 180/80  02/25/22 132/88   Wt Readings from Last 3 Encounters:  04/22/24 187 lb 12.8 oz (85.2 kg)  05/04/23  200 lb (90.7 kg)  02/25/22 194 lb (88 kg)      Physical Exam Constitutional:      General: She is not in acute distress.    Appearance: Normal appearance.  HENT:     Head: Normocephalic and atraumatic.     Nose: Nose normal.     Mouth/Throat:     Mouth: Mucous membranes are moist.  Cardiovascular:     Rate and Rhythm: Normal rate. Rhythm irregularly irregular.     Heart sounds: Normal heart sounds. No murmur heard.    No gallop.  Pulmonary:     Effort: Pulmonary effort is normal. No respiratory distress.     Breath sounds: Examination of the right-lower field reveals decreased breath sounds. Examination of the left-lower field reveals decreased breath sounds. Decreased breath sounds  present. No wheezing, rhonchi or rales.  Skin:    General: Skin is warm and dry.  Neurological:     Mental Status: She is alert and oriented to person, place, and time.        04/22/2024    2:15 PM 02/02/2022    4:35 PM 11/03/2021    1:11 PM  Depression screen PHQ 2/9  Decreased Interest 2 2 2   Down, Depressed, Hopeless 1 2 1   PHQ - 2 Score 3 4 3   Altered sleeping 0 2 2  Tired, decreased energy 2 0 1  Change in appetite  0 3  Feeling bad or failure about yourself  2 1 1   Trouble concentrating 0 0 1  Moving slowly or fidgety/restless 0 0 1  Suicidal thoughts 0 0 0  PHQ-9 Score 7 7 12   Difficult doing work/chores Not difficult at all Not difficult at all Not difficult at all      04/22/2024    2:15 PM  GAD 7 : Generalized Anxiety Score  Nervous, Anxious, on Edge 0  Control/stop worrying 1  Worry too much - different things 0  Trouble relaxing 0  Restless 0  Easily annoyed or irritable 1  Afraid - awful might happen 0  Total GAD 7 Score 2  Anxiety Difficulty Not difficult at all     No results found for any visits on 04/22/24.    Assessment & Plan:   Palpitations -     EKG 12-Lead -     CBC with Differential/Platelet; Future -     DG Chest 2 View; Future -     Comprehensive metabolic panel with GFR; Future -     Brain natriuretic peptide; Future -     TSH; Future -     T4, free; Future -     Magnesium; Future -     Ambulatory referral to Cardiology  Subacute cough -     CBC with Differential/Platelet; Future -     DG Chest 2 View; Future -     Comprehensive metabolic panel with GFR; Future -     Brain natriuretic peptide; Future -     Amoxicillin -Pot Clavulanate; Take 1 tablet by mouth in the morning and at bedtime for 7 days.  Dispense: 14 tablet; Refill: 0  Essential hypertension -     EKG 12-Lead -     DG Chest 2 View; Future -     Comprehensive metabolic panel with GFR; Future -     TSH; Future -     T4, free; Future -     Spironolactone ; Take 1  tablet (25 mg total) by mouth daily.  Dispense: 90  tablet; Refill: 0 -     Ambulatory referral to Cardiology  Combined systolic and diastolic congestive heart failure, unspecified HF chronicity (HCC) -     EKG 12-Lead -     CBC with Differential/Platelet; Future -     DG Chest 2 View; Future -     Comprehensive metabolic panel with GFR; Future -     Brain natriuretic peptide; Future -     Spironolactone ; Take 1 tablet (25 mg total) by mouth daily.  Dispense: 90 tablet; Refill: 0 -     Ambulatory referral to Cardiology  Right-sided chest wall pain -     DG Chest 2 View; Future  Tobacco abuse  Weight loss -     TSH; Future -     T4, free; Future   Patient lost to follow-up.  Seen for ongoing concern.  Subacute cough with history of tobacco abuse and CHF.  Concerns for CHF exacerbation versus COPD exacerbation.  Obtain labs.  Obtain CXR.  Will do CXR at the lung office as x-ray not available in clinic.  Start ABX.  Palpitations noted on exam.  EKG obtained in clinic.  Abnormal.  NSR with T wave inversion in V5 and V6.  ST elevation in V3.  Likely LVH by criteria.  Slow R wave progression.  Concern for ischemic changes.  Current EKG differs from prior EKGs which were reviewed.  EKG 02/25/2022 and 05/04/2023 with PVCs.  Discussed the importance of restarting medication and scheduling follow-up with cardiology.  New referral to cardiology placed.  Patient given strict ED precautions for worsening symptoms.  BP uncontrolled.  Restart spironolactone .  Patient to pick up refill of Toprol  from pharmacy today.  Smoking three-quarter pack x 20 years.  Smoking cessation encouraged.  Patient has Chantix  starter pack.  Advised to start.  Previously 200 pounds on 05/04/2023.  Currently 187.8 LBS.  Unintentional weight loss.  Obtain labs and x-ray to evaluate weight loss.  Return in about 4 weeks (around 05/20/2024) for blood pressure, chronic conditions.   I personally spent a total of 45 minutes in the  care of the patient today including getting/reviewing separately obtained history, performing a medically appropriate exam/evaluation, counseling and educating, placing orders, referring and communicating with other health care professionals, documenting clinical information in the EHR, independently interpreting results, communicating results, and coordinating care.  Clotilda JONELLE Single, MD

## 2024-04-23 ENCOUNTER — Other Ambulatory Visit (HOSPITAL_COMMUNITY): Payer: Self-pay

## 2024-04-23 ENCOUNTER — Telehealth: Payer: Self-pay | Admitting: Cardiovascular Disease

## 2024-04-23 ENCOUNTER — Ambulatory Visit (INDEPENDENT_AMBULATORY_CARE_PROVIDER_SITE_OTHER)
Admission: RE | Admit: 2024-04-23 | Discharge: 2024-04-23 | Disposition: A | Discharge status: Home / Self Care | Source: Ambulatory Visit | Attending: Family Medicine | Admitting: Family Medicine

## 2024-04-23 DIAGNOSIS — I10 Essential (primary) hypertension: Secondary | ICD-10-CM

## 2024-04-23 DIAGNOSIS — R002 Palpitations: Secondary | ICD-10-CM

## 2024-04-23 DIAGNOSIS — I5040 Unspecified combined systolic (congestive) and diastolic (congestive) heart failure: Secondary | ICD-10-CM

## 2024-04-23 DIAGNOSIS — R052 Subacute cough: Secondary | ICD-10-CM | POA: Diagnosis not present

## 2024-04-23 DIAGNOSIS — R0789 Other chest pain: Secondary | ICD-10-CM

## 2024-04-23 MED ORDER — SPIRONOLACTONE 25 MG PO TABS: 25.0000 mg | ORAL_TABLET | Freq: Every day | ORAL | 1 refills | Status: AC

## 2024-04-23 NOTE — Telephone Encounter (Signed)
  Patient can call to save money by using [employer]

## 2024-04-23 NOTE — Telephone Encounter (Signed)
 Pt c/o medication issue:  1. Name of Medication:   sacubitril -valsartan  (ENTRESTO ) 24-26 MG   2. How are you currently taking this medication (dosage and times per day)?   Currently out of this medication  3. Are you having a reaction (difficulty breathing--STAT)?   4. What is your medication issue?   Patient stated she has been out of this medication and wants to get assistance getting this medication.

## 2024-04-23 NOTE — Telephone Encounter (Signed)
*  STAT* If patient is at the pharmacy, call can be transferred to refill team.   1. Which medications need to be refilled? (please list name of each medication and dose if known)   spironolactone  (ALDACTONE ) 25 MG tablet   2. Would you like to learn more about the convenience, safety, & potential cost savings by using the [facility]?   3. Are you open to using the [facility] (Type [facility]. ).  4. Which pharmacy/location (including street and city if local pharmacy) is medication to be sent to?  [id number] - [place], [place] - [address]   5. Do they need a 30 day or 90 day supply?   30 day  Patient stated she is completely out of this medication.  Patient has appointment scheduled with CANDIE Sluder, PA on 11/3.

## 2024-04-23 NOTE — Telephone Encounter (Signed)
 Patient requesting patient assistance for Entresto  due to cost. She is out of the medication.  Patient is asking how long the process can take to get approved and she can pick up her medication. She also requests to pick-up an application from the office.  Will forward to Rx Med Assistance Team.  Patient also reports she saw PCP yesterday and had an episode. She had palpitations with elevated HR, and suspected CHF exacerbation. Scheduled patient for sooner appt with APP on 04/30/24.

## 2024-04-23 NOTE — Telephone Encounter (Signed)
 Pt's medication was sent to pt's pharmacy as requested. Confirmation received.

## 2024-04-24 ENCOUNTER — Ambulatory Visit: Payer: Self-pay | Admitting: Family Medicine

## 2024-04-24 ENCOUNTER — Telehealth: Payer: Self-pay | Admitting: Cardiovascular Disease

## 2024-04-24 MED ORDER — SACUBITRIL-VALSARTAN 24-26 MG PO TABS: 1.0000 | ORAL_TABLET | Freq: Two times a day (BID) | ORAL | 0 refills | Status: DC

## 2024-04-24 NOTE — Telephone Encounter (Signed)
 Patient given number to call [facility]

## 2024-04-24 NOTE — Telephone Encounter (Signed)
*  STAT* If patient is at the pharmacy, call can be transferred to refill team.   1. Which medications need to be refilled? (please list name of each medication and dose if known) sacubitril -valsartan  (ENTRESTO ) 24-26 MG    2. Would you like to learn more about the convenience, safety, & potential cost savings by using the [facility]? No   3. Are you open to using the [facility] ([facility]. No   4. Which pharmacy/location (including street and city if local pharmacy) is medication to be sent to? [facility] - [place], [place] - [address]    5. Do they need a 30 day or 90 day supply? 90 day

## 2024-04-24 NOTE — Telephone Encounter (Signed)
 Patient notified her refill has been sent to her pharmacy.

## 2024-04-29 NOTE — Progress Notes (Unsigned)
 Cardiology Clinic Note   Patient Name: Teresa [name] Date of Encounter: 04/30/2024  Primary Care Provider:  Mercer Clotilda SAUNDERS, MD Primary Cardiologist:  Maude Emmer, MD  Patient Profile    Teresa [name] 58 year old female presents to the clinic today for follow-up evaluation of her chronic systolic CHF and HTN.  Past Medical History    Past Medical History:  Diagnosis Date   Abnormal EKG    CHF (congestive heart failure) (HCC)    Dyspnea    Elevated troponin    HTN (hypertension)    Leg edema    Tobacco abuse    History reviewed. No pertinent surgical history.  Allergies  No Known Allergies  History of Present Illness    Teresa [name] has a PMH of HTN, tobacco abuse, chronic HFrEF, mitral valve regurgitation, and medication noncompliance.  She was initially seen and evaluated by Dr. Emmer in 2018.  Her echocardiogram 10/18 showed an EF of 25-30%, diffuse hypokinesis, and mild MR.  She continued to smoke and drink EtOH.  These were felt to be contributing factors.  Her husband passed away in 06/10/2024 from COVID.  She underwent echocardiogram 02/19/2021 which showed improved EF of 50-55%, mild MR.  During her visit with her PCP 6/23 she had run out of Entresto  and metoprolol .  She was seen in follow-up by Dr. Emmer 7/23.  During that time she reported that she had not been on Entresto  for 3 weeks.  She was educated about stopping smoking.  Follow-up in 1 year was planned.  She was seen by Rosaline Bane, NP on 05/04/2023.  During that time she noted that she was working in [employer].  She was on her feet for long periods of time.  She did note some lower extremity swelling that she felt was secondary to arthritis in her left ankle.  She noted a previous right knee injury.  She did note some shortness of breath with minimal activity occasionally.  She would typically notice this with housework.  She denied chest pain.  She continued to smoke.  She noted that  she was ready to quit smoking.  She felt that she would like to try Chantix .  She noted medication compliance.  However, she stated that she was only taking Entresto  once daily.  She was not taking spironolactone .  She denied orthopnea, PND, presyncope, syncope.  She was not monitoring her blood pressure at home.  She presents to the clinic today for follow-up evaluation and states she continues to try to stop smoking.  She reports compliance with her medications however she does note that she occasionally misses doses.  She notes that she did smoke about 1 hour before her appointment today.  Initially her blood pressure is 165/95 and on recheck is 158/82.  She continues to work in [employer].  She has been working 40 hours/week.  She does well with this.  She appears to be well compensated.  She reports that her primary care provider  told her that there were some changes on her EKG.  I have requested this.  I will repeat her echocardiogram, increase metoprolol , and plan follow-up in 6 months.  Today she denies chest pain, shortness of breath, lower extremity edema, fatigue, palpitations, melena, hematuria, hemoptysis, diaphoresis, weakness, presyncope, syncope, orthopnea, and PND.    Home Medications    Prior to Admission medications   Medication Sig Start Date End Date Taking? Authorizing Provider  amoxicillin -clavulanate (AUGMENTIN ) 500-125 MG tablet  Take 1 tablet by mouth in the morning and at bedtime for 7 days. 04/22/24 04/29/24  Mercer Clotilda SAUNDERS, MD  metoprolol  succinate (TOPROL -XL) 50 MG 24 hr tablet Take 1 tablet (50 mg total) by mouth daily. Take with or immediately following a meal. 05/04/23 04/22/24  Swinyer, Rosaline HERO, NP  sacubitril -valsartan  (ENTRESTO ) 24-26 MG Take 1 tablet by mouth 2 (two) times daily. 04/24/24   Nishan, Peter C, MD  spironolactone  (ALDACTONE ) 25 MG tablet Take 1 tablet (25 mg total) by mouth daily. 04/23/24   Delford Maude BROCKS, MD    Family History    Family  History  Problem Relation Age of Onset   CAD Mother    Diabetes Mother    She indicated that her mother is deceased. She indicated that her father is deceased.  Social History    Social History   Socioeconomic History   Marital status: Divorced    Spouse name: Not on file   Number of children: Not on file   Years of education: Not on file   Highest education level: Not on file  Occupational History   Not on file  Tobacco Use   Smoking status: Every Day    Current packs/day: 0.25    Types: Cigarettes   Smokeless tobacco: Never  Vaping Use   Vaping status: Never Used  Substance and Sexual Activity   Alcohol use: Yes    Comment: occ beer   Drug use: No   Sexual activity: Not on file  Other Topics Concern   Not on file  Social History Narrative   Not on file   Social Drivers of Health   Financial Resource Strain: Not on file  Food Insecurity: Not on file  Transportation Needs: Not on file  Physical Activity: Not on file  Stress: Not on file  Social Connections: Not on file  Intimate Partner Violence: Not on file     Review of Systems    General:  No chills, fever, night sweats or weight changes.  Cardiovascular:  No chest pain, dyspnea on exertion, edema, orthopnea, palpitations, paroxysmal nocturnal dyspnea. Dermatological: No rash, lesions/masses Respiratory: No cough, dyspnea Urologic: No hematuria, dysuria Abdominal:   No nausea, vomiting, diarrhea, bright red blood per rectum, melena, or hematemesis Neurologic:  No visual changes, wkns, changes in mental status. All other systems reviewed and are otherwise negative except as noted above.  Physical Exam    VS:  BP (!) 158/82   Pulse 80   Ht 5' 6 (1.676 m)   Wt 190 lb 12.8 oz (86.5 kg)   LMP  (LMP Unknown)   SpO2 98%   BMI 30.80 kg/m  , BMI Body mass index is 30.8 kg/m. GEN: Well nourished, well developed, in no acute distress. HEENT: normal. Neck: Supple, no JVD, carotid bruits, or  masses. Cardiac: RRR, no murmurs, rubs, or gallops. No clubbing, cyanosis, generalized nonpitting edema left greater than right.  Radials/DP/PT 2+ and equal bilaterally.  Respiratory:  Respirations regular and unlabored, clear to auscultation bilaterally. GI: Soft, nontender, nondistended, BS + x 4. MS: no deformity or atrophy. Skin: warm and dry, no rash. Neuro:  Strength and sensation are intact. Psych: Normal affect.  Accessory Clinical Findings    Recent Labs: 04/22/2024: ALT 24; BUN 8; Creatinine, Ser 0.64; Hemoglobin 12.1; Magnesium 1.9; Platelets 207.0; Potassium 4.0; Pro B Natriuretic peptide (BNP) 470.0; Sodium 139; TSH 0.88   Recent Lipid Panel    Component Value Date/Time   CHOL 152 05/18/2023 0806  TRIG 64 05/18/2023 0806   HDL 58 05/18/2023 0806   CHOLHDL 2.6 05/18/2023 0806   CHOLHDL 2.5 05/11/2017 1752   VLDL 24 05/11/2017 1752   LDLCALC 81 05/18/2023 0806    HYPERTENSION CONTROL Vitals:   04/30/24 1020 04/30/24 1043  BP: (!) 165/95 (!) 158/82    The patient's blood pressure is elevated above target today.  In order to address the patient's elevated BP: Blood pressure will be monitored at home to determine if medication changes need to be made.; A current anti-hypertensive medication was adjusted today.       ECG personally reviewed by me today-none today.        Assessment & Plan   1.  HFrEF-well compensated.  Weight stable.  NYHA class I.  Echocardiogram 7/22 showed EF 50-55%, mild MR. 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  Denies changes in her activity tolerance and work of breathing.  Does note that her PCP told her that her EKG had new changes.  I requested download.  EKG appears to be stable.  She does have lateral wall changes that are not new.  (1, aVL, V5 and V6) Heart healthy low-sodium diet Maintain physical activity Continue spironolactone , Entresto , metoprolol  Daily weights Repeat echocardiogram  Mitral valve  regurgitation-no increased DOE or activity intolerance.  Echocardiogram 7/22 showed mild MR. Continue metoprolol  Repeat echocardiogram Maintain physical activity  Essential hypertension-BP today 158/82. Heart healthy low-sodium diet Continue spironolactone , metoprolol , Entresto  Increase metoprolol   Tobacco abuse-continues to work to stop smoking. Continues to take Chantix .  Disposition: Follow-up with Dr. Nishan or me in 12 months.   Josefa HERO. Desarae Placide NP-C     04/30/2024, 10:50 AM [facility] [address] [place], [place] [address] Office [phone]    Notice: This dictation was prepared with Dragon dictation along with smaller phrase technology. Any transcriptional errors that result from this process are unintentional and may not be corrected upon review.   I spent 14 minutes examining this patient, reviewing medications, and using patient centered shared decision making involving their cardiac care.   I spent  20 minutes reviewing past medical history,  medications, and prior cardiac tests.

## 2024-04-30 ENCOUNTER — Ambulatory Visit: Attending: General Practice | Admitting: General Practice

## 2024-04-30 ENCOUNTER — Encounter: Payer: Self-pay | Admitting: General Practice

## 2024-04-30 VITALS — BP 158/82 | HR 80 | Ht 66.0 in | Wt 190.8 lb

## 2024-04-30 DIAGNOSIS — Z720 Tobacco use: Secondary | ICD-10-CM

## 2024-04-30 DIAGNOSIS — I5022 Chronic systolic (congestive) heart failure: Secondary | ICD-10-CM | POA: Diagnosis not present

## 2024-04-30 DIAGNOSIS — I340 Nonrheumatic mitral (valve) insufficiency: Secondary | ICD-10-CM | POA: Diagnosis not present

## 2024-04-30 DIAGNOSIS — I10 Essential (primary) hypertension: Secondary | ICD-10-CM

## 2024-04-30 MED ORDER — METOPROLOL SUCCINATE ER 100 MG PO TB24: 100.0000 mg | ORAL_TABLET | Freq: Every day | ORAL | 3 refills | Status: AC

## 2024-04-30 NOTE — Patient Instructions (Signed)
 Medication Instructions:  Your physician has recommended you make the following change in your medication:  INCREASE the Metoprolol  to 100 mg taking 1 daily  *If you need a refill on your cardiac medications before your next appointment, please call your pharmacy*  Lab Work: None ordered  If you have labs (blood work) drawn today and your tests are completely normal, you will receive your results only by: MyChart Message (if you have MyChart) OR A paper copy in the mail If you have any lab test that is abnormal or we need to change your treatment, we will call you to review the results.  Testing/Procedures: Your physician has requested that you have an echocardiogram. Echocardiography is a painless test that uses sound waves to create images of your heart. It provides your doctor with information about the size and shape of your heart and how well your heart's chambers and valves are working. This procedure takes approximately one hour. There are no restrictions for this procedure. Please do NOT wear cologne, perfume, aftershave, or lotions (deodorant is allowed). Please arrive 15 minutes prior to your appointment time.  Please note: We ask at that you not bring children with you during ultrasound (echo/ vascular) testing. Due to room size and safety concerns, children are not allowed in the ultrasound rooms during exams. Our front office staff cannot provide observation of children in our lobby area while testing is being conducted. An adult accompanying a patient to their appointment will only be allowed in the ultrasound room at the discretion of the ultrasound technician under special circumstances. We apologize for any inconvenience.   Follow-Up: At [facility], you and your health needs are our priority.  As part of our continuing mission to provide you with exceptional heart care, our providers are all part of one team.  This team includes your primary Cardiologist (physician)  and Advanced Practice Providers or APPs (Physician Assistants and Nurse Practitioners) who all work together to provide you with the care you need, when you need it.  Your next appointment:   6 month(s)  Provider:   Maude Emmer, MD    We recommend signing up for the patient portal called MyChart.  Sign up information is provided on this After Visit Summary.  MyChart is used to connect with patients for Virtual Visits (Telemedicine).  Patients are able to view lab/test results, encounter notes, upcoming appointments, etc.  Non-urgent messages can be sent to your provider as well.   To learn more about what you can do with MyChart, go to [email or web address].   Other Instructions

## 2024-05-20 ENCOUNTER — Ambulatory Visit: Admitting: Family Medicine

## 2024-05-21 ENCOUNTER — Encounter: Payer: Self-pay | Admitting: Family Medicine

## 2024-05-29 ENCOUNTER — Ambulatory Visit: Admitting: Family Medicine

## 2024-05-29 ENCOUNTER — Encounter: Payer: Self-pay | Admitting: Family Medicine

## 2024-05-29 VITALS — BP 170/86 | HR 62 | Temp 98.5°F | Ht 66.0 in | Wt 190.4 lb

## 2024-05-29 DIAGNOSIS — Z23 Encounter for immunization: Secondary | ICD-10-CM

## 2024-05-29 DIAGNOSIS — F17210 Nicotine dependence, cigarettes, uncomplicated: Secondary | ICD-10-CM | POA: Diagnosis not present

## 2024-05-29 DIAGNOSIS — Z131 Encounter for screening for diabetes mellitus: Secondary | ICD-10-CM

## 2024-05-29 DIAGNOSIS — Z1231 Encounter for screening mammogram for malignant neoplasm of breast: Secondary | ICD-10-CM

## 2024-05-29 DIAGNOSIS — I10 Essential (primary) hypertension: Secondary | ICD-10-CM

## 2024-05-29 DIAGNOSIS — I5022 Chronic systolic (congestive) heart failure: Secondary | ICD-10-CM | POA: Diagnosis not present

## 2024-05-29 DIAGNOSIS — R7303 Prediabetes: Secondary | ICD-10-CM | POA: Insufficient documentation

## 2024-05-29 LAB — POCT GLYCOSYLATED HEMOGLOBIN (HGB A1C): Hemoglobin A1C: 6 % — AB (ref 4.0–5.6)

## 2024-05-29 MED ORDER — NICOTINE POLACRILEX 4 MG MT GUM: 4.0000 mg | CHEWING_GUM | OROMUCOSAL | 1 refills | Status: AC | PRN

## 2024-05-29 NOTE — Progress Notes (Signed)
 Established Patient Office Visit   Subjective  Patient ID: Teresa [name], female    DOB: [birth year]  Age: 58 y.o. MRN: [id number]  Chief Complaint  Patient presents with   Medical Management of Chronic Issues    Patient came in today for 4 week follow-up for Blood pressure,     Patient is a 58 year old female seen for follow-up.  Patient states she is not sure what BP has been at home she has yet to obtain BP cuff.  After last visit seen by cardiology.  States metoprolol  was increased from 50 to 100 mg daily.  Also taking spironolactone  25 mg daily and Entresto .  Endorses eating increased sweets and carbs.  Patient states BP is elevated this visit due to smoking prior to appointment.  Patient currently smoking half pack per day.  Started in her teens.  Knows she needs to quit.  In the past Nicorette gum helped.  Never picked up prescription for Chantix .  Patient concerned about diabetes.  Denies current symptoms.  Mentions family history of prediabetes.  Would like to be checked.  Endorses eating increased sweets and carbs.  Denies LE edema, SOB, CP, headaches, changes in vision.  Patient Active Problem List   Diagnosis Date Noted   Hypertension 05/11/2017   Orthopnea 05/11/2017   Leg edema 05/11/2017   Anemia 05/11/2017   Elevated troponin    Dyspnea    Abnormal EKG    Tobacco abuse    Past Medical History:  Diagnosis Date   Abnormal EKG    CHF (congestive heart failure) (HCC)    Dyspnea    Elevated troponin    HTN (hypertension)    Leg edema    Tobacco abuse    History reviewed. No pertinent surgical history. Social History   Tobacco Use   Smoking status: Every Day    Current packs/day: 0.25    Types: Cigarettes   Smokeless tobacco: Never  Vaping Use   Vaping status: Never Used  Substance Use Topics   Alcohol use: Yes    Comment: occ beer   Drug use: No   Family History  Problem Relation Age of Onset   CAD Mother    Diabetes Mother    No Known  Allergies  ROS Negative unless stated above    Objective:     BP (!) 170/86 (BP Location: Left Arm, Patient Position: Sitting, Cuff Size: Large)   Pulse 62   Temp 98.5 F (36.9 C) (Oral)   Ht 5' 6 (1.676 m)   Wt 190 lb 6.4 oz (86.4 kg)   LMP  (LMP Unknown)   SpO2 100%   BMI 30.73 kg/m  BP Readings from Last 3 Encounters:  05/29/24 (!) 170/86  04/30/24 (!) 158/82  04/22/24 (!) 162/100   Wt Readings from Last 3 Encounters:  05/29/24 190 lb 6.4 oz (86.4 kg)  04/30/24 190 lb 12.8 oz (86.5 kg)  04/22/24 187 lb 12.8 oz (85.2 kg)      Physical Exam Constitutional:      General: She is not in acute distress.    Appearance: Normal appearance.  HENT:     Head: Normocephalic and atraumatic.     Nose: Nose normal.     Mouth/Throat:     Mouth: Mucous membranes are moist.  Cardiovascular:     Rate and Rhythm: Normal rate and regular rhythm.     Heart sounds: Normal heart sounds. No murmur heard.    No gallop.  Pulmonary:  Effort: Pulmonary effort is normal. No respiratory distress.     Breath sounds: Normal breath sounds. No wheezing, rhonchi or rales.  Skin:    General: Skin is warm and dry.  Neurological:     Mental Status: She is alert and oriented to person, place, and time.        05/29/2024   10:29 AM 04/22/2024    2:15 PM 02/02/2022    4:35 PM  Depression screen PHQ 2/9  Decreased Interest 0 2 2  Down, Depressed, Hopeless 0 1 2  PHQ - 2 Score 0 3 4  Altered sleeping 0 0 2  Tired, decreased energy 0 2 0  Change in appetite 1  0  Feeling bad or failure about yourself  0 2 1  Trouble concentrating 0 0 0  Moving slowly or fidgety/restless 0 0 0  Suicidal thoughts 0 0 0  PHQ-9 Score 1 7 7   Difficult doing work/chores Not difficult at all Not difficult at all Not difficult at all      05/29/2024   10:29 AM 04/22/2024    2:15 PM  GAD 7 : Generalized Anxiety Score  Nervous, Anxious, on Edge 0 0  Control/stop worrying 1 1  Worry too much - different  things 0 0  Trouble relaxing 0 0  Restless 0 0  Easily annoyed or irritable 0 1  Afraid - awful might happen 0 0  Total GAD 7 Score 1 2  Anxiety Difficulty Not difficult at all Not difficult at all     Results for orders placed or performed in visit on 05/29/24  POC HgB A1c  Result Value Ref Range   Hemoglobin A1C 6.0 (A) 4.0 - 5.6 %   HbA1c POC (<> result, manual entry)     HbA1c, POC (prediabetic range)     HbA1c, POC (controlled diabetic range)        Assessment & Plan:   Essential hypertension  Cigarette nicotine  dependence without complication -     Nicotine  Polacrilex; Take 1 each (4 mg total) by mouth as needed for smoking cessation.  Dispense: 100 tablet; Refill: 1  Chronic systolic heart failure (HCC)  Screening for diabetes mellitus -     POCT glycosylated hemoglobin (Hb A1C)  Need for influenza vaccination -     Flu vaccine trivalent PF, 6mos and older(Flulaval,Afluria,Fluarix,Fluzone)  Need for tetanus booster -     Tdap vaccine greater than or equal to 7yo IM  Encounter for screening mammogram for malignant neoplasm of breast -     Digital Screening Mammogram, Left and Right; Future  Prediabetes   HTN uncontrolled.  Metoprolol  recently increased by Cardiology and pt smoked a cigarette prior to appt. Continue Toprol -XL 100 mg daily.,  Entresto  24-26 mg twice daily, spironolactone  25 mg daily.  Patient euvolemic on exam.  Lifestyle recommendation strongly encouraged.  Smoking cessation counseling greater than 3 minutes, less than 10 minutes.  Patient contemplative.  Currently smoking half pack per day.  Given Rx for Nicorette gum.  Influenza and Tdap vaccines given this visit.  Order for mammogram to screen for breast cancer placed this visit.  Screening for DM given family history and pt concern.  A1c 6.0% this visit.  Lifestyle modification strongly encouraged including decreasing sweet and carbohydrate intake.  Continue to monitor.  Return in about 3 months  (around 08/29/2024) for blood pressure.   Clotilda JONELLE Single, MD

## 2024-06-06 ENCOUNTER — Ambulatory Visit (HOSPITAL_COMMUNITY): Admission: RE | Admit: 2024-06-06 | Source: Ambulatory Visit

## 2024-06-07 ENCOUNTER — Encounter (HOSPITAL_COMMUNITY): Payer: Self-pay | Admitting: General Practice

## 2024-06-10 ENCOUNTER — Encounter: Payer: Self-pay | Admitting: Cardiology

## 2024-06-10 ENCOUNTER — Ambulatory Visit: Attending: Cardiology | Admitting: Cardiology

## 2024-06-10 ENCOUNTER — Ambulatory Visit: Admitting: Cardiology

## 2024-06-10 VITALS — BP 164/98 | HR 84 | Ht 66.0 in | Wt 193.2 lb

## 2024-06-10 DIAGNOSIS — I10 Essential (primary) hypertension: Secondary | ICD-10-CM | POA: Diagnosis not present

## 2024-06-10 DIAGNOSIS — I340 Nonrheumatic mitral (valve) insufficiency: Secondary | ICD-10-CM

## 2024-06-10 DIAGNOSIS — I5020 Unspecified systolic (congestive) heart failure: Secondary | ICD-10-CM

## 2024-06-10 DIAGNOSIS — Z720 Tobacco use: Secondary | ICD-10-CM | POA: Diagnosis not present

## 2024-06-10 MED ORDER — SACUBITRIL-VALSARTAN 49-51 MG PO TABS: 1.0000 | ORAL_TABLET | Freq: Two times a day (BID) | ORAL | 3 refills | Status: AC

## 2024-06-10 NOTE — Patient Instructions (Addendum)
 Medication Instructions:  Increase Entresto  to 49-51mg . 1 tab 2 times per day.  *If you need a refill on your cardiac medications before your next appointment, please call your pharmacy*  Lab Work: BMP today, then repeat in 2 weeks.   If you have labs (blood work) drawn today and your tests are completely normal, you will receive your results only by: MyChart Message (if you have MyChart) OR A paper copy in the mail If you have any lab test that is abnormal or we need to change your treatment, we will call you to review the results.  Testing/Procedures: Echocardiogram Your physician has requested that you have an echocardiogram. Echocardiography is a painless test that uses sound waves to create images of your heart. It provides your doctor with information about the size and shape of your heart and how well your heart's chambers and valves are working. This procedure takes approximately one hour. There are no restrictions for this procedure. Please do NOT wear cologne, perfume, aftershave, or lotions (deodorant is allowed). Please arrive 15 minutes prior to your appointment time.  Please note: We ask at that you not bring children with you during ultrasound (echo/ vascular) testing. Due to room size and safety concerns, children are not allowed in the ultrasound rooms during exams. Our front office staff cannot provide observation of children in our lobby area while testing is being conducted. An adult accompanying a patient to their appointment will only be allowed in the ultrasound room at the discretion of the ultrasound technician under special circumstances. We apologize for any inconvenience.  Follow-Up: At [facility], you and your health needs are our priority.  As part of our continuing mission to provide you with exceptional heart care, our providers are all part of one team.  This team includes your primary Cardiologist (physician) and Advanced Practice Providers or APPs  (Physician Assistants and Nurse Practitioners) who all work together to provide you with the care you need, when you need it.  Your next appointment:   3 - 4 months    Provider:   Maude Emmer, MD    We recommend signing up for the patient portal called MyChart.  Sign up information is provided on this After Visit Summary.  MyChart is used to connect with patients for Virtual Visits (Telemedicine).  Patients are able to view lab/test results, encounter notes, upcoming appointments, etc.  Non-urgent messages can be sent to your provider as well.   To learn more about what you can do with MyChart, go to [email or web address].   Other Instructions

## 2024-06-10 NOTE — Progress Notes (Signed)
 Cardiology Office Note:  .   Date:  06/10/2024  ID:  Teresa [name], DOB 1966-06-04, MRN [id number] PCP: Mercer Clotilda SAUNDERS, MD  [facility] Providers Cardiologist:  Maude Emmer, MD {  History of Present Illness: .   Teresa [name] is a 58 y.o. female with history of heart failure with recovered EF, mitral regurgitation, hypertension, tobacco abuse,     Cardiomyopathy 05/2017 admission, EF 25 to 30%, diffuse hypokinesis.  Thought related to smoking/alcohol history.  History of noncompliance during that time. 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  Trying to quit.  Some days she will smoke a full pack of the day she only smoked 3 cigarettes. Reports THC use.  Drinks beers occasionally.     Patient with history of heart failure with recovered EF, etiology not obvious.  History has been complicated by noncompliance so evaluation for this had been delayed.  EF recovered in 2022 and she has had stable NYHA class I symptoms.  Last seen 04/2024 and overall doing well, echocardiogram was repeated but she no showed.  Toprol -XL was increased to 100 mg from 50 as she has had persistently elevated blood pressures.  She was scheduled for 1 year follow-up since overall she was doing well without any complaints.  Today patient is here for follow-up.  She has no acute complaints.  Continues to smoke and trying to quit.  She did not like her Chantix  and has discontinued this saying that at lower doses so was not very beneficial.  She is using gums and lozenges instead.  From a cardiac standpoint she has denied any functional limitations and still working 40+ hours at her job without any limitations.  Previously has had history of noncompliance but she reports that she has been much better about this recently.  ROS: Denies: Chest pain, shortness of breath, orthopnea, peripheral edema, palpitations,  decreased exercise intolerance, fatigue, lightheadedness.   Studies Reviewed: .         Risk Assessment/Calculations:        Physical Exam:   VS:  BP (!) 164/98   Pulse 84   Ht 5' 6 (1.676 m)   Wt 193 lb 3.2 oz (87.6 kg)   LMP  (LMP Unknown)   SpO2 98%   BMI 31.18 kg/m    Wt Readings from Last 3 Encounters:  06/10/24 193 lb 3.2 oz (87.6 kg)  05/29/24 190 lb 6.4 oz (86.4 kg)  04/30/24 190 lb 12.8 oz (86.5 kg)    GEN: Well nourished, well developed in no acute distress NECK: No JVD; No carotid bruits CARDIAC: RRR, no murmurs, rubs, gallops RESPIRATORY:  Clear to auscultation without rales, wheezing or rhonchi  ABDOMEN: Soft, non-tender, non-distended EXTREMITIES:  No edema; No deformity   ASSESSMENT AND PLAN: .    Heart failure with recovered EF -05/2017 EF 25 to 30%, diffuse hypokinesis.  Heavy smoking/alcohol history. -02/2021 EF recovered 50 to 55% Unclear etiology but exhibits NYHA class I symptoms.  Today she is euvolemic on exam with no complaints. GDMT: Increase Entresto  24-26 mg twice daily to 49-51 mg twice daily.  Get BMP today and then again in 2 weeks.  Continue Toprol -XL 100 mg, spironolactone  25 mg. She has pending echocardiogram, no-showed her initial appointment.  We will reorder.  Mitral regurgitation Mild MR noted in 2022.  Echo as above.  Tobacco abuse She self discontinued her Chantix .  She is trying to quit.  Using lozenges and gums.  Hypertension Blood pressure today 164/98 and also elevated at previous office visit.  Adjustments to medications made as above.  If she continues to exhibit signs of resistant hypertension we will start working up secondary etiologies.     Dispo: 3 to 59-month follow-up with Dr. Delford to go over echocardiogram and follow-up on blood pressure.  Signed, Thom LITTIE Sluder, PA-C

## 2024-06-11 ENCOUNTER — Ambulatory Visit: Payer: Self-pay | Admitting: Cardiology

## 2024-06-11 LAB — BASIC METABOLIC PANEL WITH GFR
BUN/Creatinine Ratio: 18 (ref 9–23)
BUN: 11 mg/dL (ref 6–24)
CO2: 26 mmol/L (ref 20–29)
Calcium: 9.3 mg/dL (ref 8.7–10.2)
Chloride: 102 mmol/L (ref 96–106)
Creatinine, Ser: 0.6 mg/dL (ref 0.57–1.00)
Glucose: 94 mg/dL (ref 70–99)
Potassium: 3.9 mmol/L (ref 3.5–5.2)
Sodium: 141 mmol/L (ref 134–144)
eGFR: 103 mL/min/1.73 (ref >59)

## 2024-06-21 ENCOUNTER — Ambulatory Visit
Admission: RE | Admit: 2024-06-21 | Discharge: 2024-06-21 | Disposition: A | Discharge status: Home / Self Care | Source: Ambulatory Visit | Attending: Family Medicine | Admitting: Family Medicine

## 2024-06-21 DIAGNOSIS — Z1231 Encounter for screening mammogram for malignant neoplasm of breast: Secondary | ICD-10-CM

## 2024-06-30 ENCOUNTER — Other Ambulatory Visit: Payer: Self-pay | Admitting: Cardiovascular Disease

## 2024-07-19 ENCOUNTER — Ambulatory Visit (HOSPITAL_COMMUNITY)
Admission: RE | Admit: 2024-07-19 | Discharge: 2024-07-19 | Disposition: A | Source: Ambulatory Visit | Attending: Cardiology | Admitting: Cardiology

## 2024-07-19 DIAGNOSIS — I5022 Chronic systolic (congestive) heart failure: Secondary | ICD-10-CM

## 2024-07-19 LAB — ECHOCARDIOGRAM COMPLETE
Area-P 1/2: 2.91 cm2
S' Lateral: 3.25 cm

## 2024-07-22 ENCOUNTER — Ambulatory Visit: Payer: Self-pay | Admitting: General Practice
